# Patient Record
Sex: Female | Born: 1985 | Race: White | Hispanic: No | Marital: Married | Smoking: Former smoker
Health system: Southern US, Community
[De-identification: ages and names within clinical notes are randomized; demographics above are authoritative.]

## PROBLEM LIST (undated history)

## (undated) DIAGNOSIS — E282 Polycystic ovarian syndrome: Secondary | ICD-10-CM

## (undated) DIAGNOSIS — E8881 Metabolic syndrome: Secondary | ICD-10-CM

## (undated) DIAGNOSIS — Z6841 Body Mass Index (BMI) 40.0 and over, adult: Secondary | ICD-10-CM

## (undated) DIAGNOSIS — E88819 Insulin resistance, unspecified: Secondary | ICD-10-CM

## (undated) HISTORY — PX: NO PAST SURGERIES: SHX2092

---
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
## 2013-09-04 ENCOUNTER — Other Ambulatory Visit: Payer: Self-pay | Admitting: Family Medicine

## 2013-09-04 DIAGNOSIS — N926 Irregular menstruation, unspecified: Secondary | ICD-10-CM

## 2013-09-05 ENCOUNTER — Ambulatory Visit
Admission: RE | Admit: 2013-09-05 | Discharge: 2013-09-05 | Disposition: A | Discharge status: Home / Self Care | Payer: 59 | Source: Ambulatory Visit | Attending: Family Medicine | Admitting: Family Medicine

## 2013-09-05 DIAGNOSIS — N926 Irregular menstruation, unspecified: Secondary | ICD-10-CM

## 2014-08-21 ENCOUNTER — Encounter (HOSPITAL_COMMUNITY): Payer: Self-pay | Admitting: *Deleted

## 2014-08-21 ENCOUNTER — Emergency Department (HOSPITAL_COMMUNITY): Payer: 59

## 2014-08-21 ENCOUNTER — Emergency Department (HOSPITAL_COMMUNITY)
Admission: EM | Admit: 2014-08-21 | Discharge: 2014-08-21 | Disposition: A | Discharge status: Home / Self Care | Payer: Self-pay | Attending: Emergency Medicine | Admitting: Emergency Medicine

## 2014-08-21 DIAGNOSIS — Y939 Activity, unspecified: Secondary | ICD-10-CM | POA: Insufficient documentation

## 2014-08-21 DIAGNOSIS — M5442 Lumbago with sciatica, left side: Secondary | ICD-10-CM | POA: Insufficient documentation

## 2014-08-21 DIAGNOSIS — Z720 Tobacco use: Secondary | ICD-10-CM | POA: Insufficient documentation

## 2014-08-21 DIAGNOSIS — Y999 Unspecified external cause status: Secondary | ICD-10-CM | POA: Insufficient documentation

## 2014-08-21 DIAGNOSIS — X58XXXA Exposure to other specified factors, initial encounter: Secondary | ICD-10-CM | POA: Insufficient documentation

## 2014-08-21 DIAGNOSIS — Y929 Unspecified place or not applicable: Secondary | ICD-10-CM | POA: Insufficient documentation

## 2014-08-21 MED ORDER — METHOCARBAMOL 500 MG PO TABS: 500.0000 mg | ORAL_TABLET | Freq: Two times a day (BID) | ORAL | Status: DC

## 2014-08-21 MED ORDER — DIAZEPAM 5 MG/ML IJ SOLN
5.0000 mg | Freq: Once | INTRAMUSCULAR | Status: DC
  Filled 2014-08-21: qty 2

## 2014-08-21 MED ORDER — KETOROLAC TROMETHAMINE 60 MG/2ML IM SOLN
60.0000 mg | Freq: Once | INTRAMUSCULAR | Status: DC
  Filled 2014-08-21: qty 2

## 2014-08-21 MED ORDER — DIAZEPAM 5 MG/ML IJ SOLN
5.0000 mg | Freq: Once | INTRAMUSCULAR | Status: AC
  Administered 2014-08-21: 5 mg via INTRAVENOUS

## 2014-08-21 MED ORDER — KETOROLAC TROMETHAMINE 30 MG/ML IJ SOLN
30.0000 mg | Freq: Once | INTRAMUSCULAR | Status: AC
  Administered 2014-08-21: 30 mg via INTRAVENOUS
  Filled 2014-08-21: qty 1

## 2014-08-21 MED ORDER — HYDROCODONE-ACETAMINOPHEN 5-325 MG PO TABS: 1.0000 | ORAL_TABLET | Freq: Four times a day (QID) | ORAL | Status: DC | PRN

## 2014-08-21 NOTE — ED Notes (Signed)
The pt has pain in her entire spine since Monday when she lifted her dog and a bag of dog  Food.  She  Felt something pop and has had pain since then.  lmp irregular

## 2014-08-21 NOTE — ED Notes (Signed)
Pt stable, ambulatory, states understanding of discharge instructions 

## 2014-08-21 NOTE — Discharge Instructions (Signed)
Please follow up with your primary care physician in 1-2 days. If you do not have one please call the [facility] number listed above. Please take pain medication and/or muscle relaxants as prescribed and as needed for pain. Please do not drive on narcotic pain medication or on muscle relaxants. Please read all discharge instructions and return precautions.  ° °Radicular Pain °Radicular pain in either the arm or leg is usually from a bulging or herniated disk in the spine. A piece of the herniated disk may press against the nerves as the nerves exit the spine. This causes pain which is felt at the tips of the nerves down the arm or leg. Other causes of radicular pain may include: °· Fractures. °· Heart disease. °· Cancer. °· An abnormal and usually degenerative state of the nervous system or nerves (neuropathy). °Diagnosis may require CT or MRI scanning to determine the primary cause.  °Nerves that start at the neck (nerve roots) may cause radicular pain in the outer shoulder and arm. It can spread down to the thumb and fingers. The symptoms vary depending on which nerve root has been affected. In most cases radicular pain improves with conservative treatment. Neck problems may require physical therapy, a neck collar, or cervical traction. Treatment may take many weeks, and surgery may be considered if the symptoms do not improve.  °Conservative treatment is also recommended for sciatica. Sciatica causes pain to radiate from the lower back or buttock area down the leg into the foot. Often there is a history of back problems. Most patients with sciatica are better after 2 to 4 weeks of rest and other supportive care. Short term bed rest can reduce the disk pressure considerably. Sitting, however, is not a good position since this increases the pressure on the disk. You should avoid bending, lifting, and all other activities which make the problem worse. Traction can be used in severe cases.  Surgery is usually reserved for patients who do not improve within the first months of treatment. °Only take over-the-counter or prescription medicines for pain, discomfort, or fever as directed by your caregiver. Narcotics and muscle relaxants may help by relieving more severe pain and spasm and by providing mild sedation. Cold or massage can give significant relief. Spinal manipulation is not recommended. It can increase the degree of disc protrusion. Epidural steroid injections are often effective treatment for radicular pain. These injections deliver medicine to the spinal nerve in the space between the protective covering of the spinal cord and back bones (vertebrae). Your caregiver can give you more information about steroid injections. These injections are most effective when given within two weeks of the onset of pain.  °You should see your caregiver for follow up care as recommended. A program for neck and back injury rehabilitation with stretching and strengthening exercises is an important part of management.  °SEEK IMMEDIATE MEDICAL CARE IF: °· You develop increased pain, weakness, or numbness in your arm or leg. °· You develop difficulty with bladder or bowel control. °· You develop abdominal pain. °Document Released: 02/11/2004 Document Revised: 03/28/2011 Document Reviewed: 04/28/2008 °ExitCare® Patient Information ©2015 [employer]. This information is not intended to replace advice given to you by your health care provider. Make sure you discuss any questions you have with your health care provider. ° °

## 2014-08-21 NOTE — ED Provider Notes (Signed)
CSN: [id number]     Arrival date & time 08/21/14  0404 History   First MD Initiated Contact with Patient 08/21/14 [phone]     Chief Complaint  Patient presents with  . Back Pain     (Consider location/radiation/quality/duration/timing/severity/associated sxs/prior Treatment) HPI Comments: The pt has pain in her entire spine since Monday when she lifted her dog and a bag of dog Food. She Felt something pop and has had pain since then.  Patient is a 29 y.o. female presenting with back pain.  Back Pain Location:  Lumbar spine and sacro-iliac joint Quality:  Shooting and stiffness Stiffness is present:  All day Radiates to:  L posterior upper leg, L knee and L foot Pain severity:  Severe Onset quality:  Sudden Duration:  3 days Timing:  Constant Progression:  Worsening Chronicity:  New Context: lifting heavy objects   Relieved by:  Nothing Worsened by:  Twisting and ambulation Ineffective treatments:  Cold packs and heating pad (Tylenol) Associated symptoms: no bladder incontinence, no bowel incontinence, no fever, no numbness, no paresthesias, no perianal numbness, no tingling and no weakness   Risk factors: no hx of cancer   Risk factors comment:  No h/o IVDA   History reviewed. No pertinent past medical history. History reviewed. No pertinent past surgical history. No family history on file. History  Substance Use Topics  . Smoking status: Current Every Day Smoker  . Smokeless tobacco: Not on file  . Alcohol Use: No   OB History    No data available     Review of Systems  Constitutional: Negative for fever.  Gastrointestinal: Negative for bowel incontinence.  Genitourinary: Negative for bladder incontinence.  Musculoskeletal: Positive for back pain.  Neurological: Negative for tingling, weakness, numbness and paresthesias.  All other systems reviewed and are negative.     Allergies  Review of patient's allergies indicates no known allergies.  Home  Medications   Prior to Admission medications   Medication Sig Start Date End Date Taking? Authorizing Provider  acetaminophen (TYLENOL) 325 MG tablet Take 650 mg by mouth every 6 (six) hours as needed for mild pain.   Yes Historical Provider, MD  HYDROcodone-acetaminophen (NORCO/VICODIN) 5-325 MG per tablet Take 1-2 tablets by mouth every 6 (six) hours as needed. 08/21/14   Kaycie Pegues, PA-C  methocarbamol (ROBAXIN) 500 MG tablet Take 1 tablet (500 mg total) by mouth 2 (two) times daily. 08/21/14   Tyiesha Brackney, PA-C   BP 145/73 mmHg  Pulse 67  Temp(Src) 98 F (36.7 C) (Oral)  Resp 20  Wt 355 lb (161.027 kg)  SpO2 97%  LMP  Physical Exam  Constitutional: She is oriented to person, place, and time. She appears well-developed and well-nourished. No distress.  HENT:  Head: Normocephalic and atraumatic.  Right Ear: External ear normal.  Left Ear: External ear normal.  Nose: Nose normal.  Mouth/Throat: Oropharynx is clear and moist. No oropharyngeal exudate.  Eyes: Conjunctivae and EOM are normal. Pupils are equal, round, and reactive to light.  Neck: Normal range of motion. Neck supple.  Cardiovascular: Normal rate, regular rhythm, normal heart sounds and intact distal pulses.   Pulmonary/Chest: Effort normal and breath sounds normal. No respiratory distress.  Abdominal: Soft. There is no tenderness.  Musculoskeletal:       Cervical back: Normal.       Thoracic back: Normal.       Lumbar back: She exhibits tenderness, pain and spasm. She exhibits normal range of motion, no bony tenderness  and no deformity.  Positive SLE   Neurological: She is alert and oriented to person, place, and time. She has normal strength. No cranial nerve deficit. GCS eye subscore is 4. GCS verbal subscore is 5. GCS motor subscore is 6.  Sensation grossly intact.  No pronator drift.    Skin: Skin is warm and dry. She is not diaphoretic.  Nursing note and vitals reviewed.   ED Course   Procedures (including critical care time) Medications  diazepam (VALIUM) injection 5 mg (5 mg Intravenous Given 08/21/14 0454)  ketorolac (TORADOL) 30 MG/ML injection 30 mg (30 mg Intravenous Given 08/21/14 0506)    Labs Review Labs Reviewed - No data to display  Imaging Review No results found.   EKG Interpretation None      5:50 AM Patient is adamant about receiving imaging will order  MDM   Final diagnoses:  Bilateral low back pain with left-sided sciatica    Filed Vitals:   08/21/14 0550  BP: 145/73  Pulse: 67  Temp:   Resp: 20   Afebrile, NAD, non-toxic appearing, AAOx4.   No red flags for back pain.  No neurological deficits and normal neuro exam. RICE protocol and pain medicine indicated and discussed with patient. Will sign patient out to Arthor Captain, PA-C pending x-ray results.        Francee Piccolo, PA-C 08/21/14 1610  Blake Divine, MD 08/21/14 [phone]

## 2020-02-10 DIAGNOSIS — E8881 Metabolic syndrome: Secondary | ICD-10-CM | POA: Diagnosis present

## 2020-02-10 DIAGNOSIS — E282 Polycystic ovarian syndrome: Secondary | ICD-10-CM | POA: Diagnosis present

## 2020-02-10 DIAGNOSIS — O99210 Obesity complicating pregnancy, unspecified trimester: Secondary | ICD-10-CM | POA: Diagnosis present

## 2020-02-11 DIAGNOSIS — O26899 Other specified pregnancy related conditions, unspecified trimester: Secondary | ICD-10-CM

## 2020-04-05 DIAGNOSIS — O09529 Supervision of elderly multigravida, unspecified trimester: Secondary | ICD-10-CM

## 2020-09-20 ENCOUNTER — Inpatient Hospital Stay (HOSPITAL_COMMUNITY)
Admission: AD | Admit: 2020-09-20 | Discharge: 2020-09-23 | DRG: 786 | Disposition: A | Discharge status: Home / Self Care | Payer: 59 | Attending: Obstetrics and Gynecology | Admitting: Obstetrics and Gynecology

## 2020-09-20 ENCOUNTER — Inpatient Hospital Stay (HOSPITAL_COMMUNITY): Payer: 59 | Admitting: Anesthesiology

## 2020-09-20 ENCOUNTER — Encounter (HOSPITAL_COMMUNITY): Payer: Self-pay | Admitting: Obstetrics and Gynecology

## 2020-09-20 ENCOUNTER — Other Ambulatory Visit: Payer: Self-pay

## 2020-09-20 DIAGNOSIS — O26899 Other specified pregnancy related conditions, unspecified trimester: Secondary | ICD-10-CM

## 2020-09-20 DIAGNOSIS — O403XX0 Polyhydramnios, third trimester, not applicable or unspecified: Secondary | ICD-10-CM | POA: Diagnosis present

## 2020-09-20 DIAGNOSIS — O134 Gestational [pregnancy-induced] hypertension without significant proteinuria, complicating childbirth: Secondary | ICD-10-CM | POA: Diagnosis present

## 2020-09-20 DIAGNOSIS — O2412 Pre-existing diabetes mellitus, type 2, in childbirth: Secondary | ICD-10-CM | POA: Diagnosis present

## 2020-09-20 DIAGNOSIS — O99214 Obesity complicating childbirth: Secondary | ICD-10-CM | POA: Diagnosis present

## 2020-09-20 DIAGNOSIS — O3663X0 Maternal care for excessive fetal growth, third trimester, not applicable or unspecified: Secondary | ICD-10-CM | POA: Diagnosis present

## 2020-09-20 DIAGNOSIS — Z6791 Unspecified blood type, Rh negative: Secondary | ICD-10-CM | POA: Diagnosis not present

## 2020-09-20 DIAGNOSIS — Z3A39 39 weeks gestation of pregnancy: Secondary | ICD-10-CM

## 2020-09-20 DIAGNOSIS — E282 Polycystic ovarian syndrome: Secondary | ICD-10-CM | POA: Diagnosis present

## 2020-09-20 DIAGNOSIS — O9081 Anemia of the puerperium: Secondary | ICD-10-CM | POA: Diagnosis not present

## 2020-09-20 DIAGNOSIS — O09529 Supervision of elderly multigravida, unspecified trimester: Secondary | ICD-10-CM

## 2020-09-20 DIAGNOSIS — O99210 Obesity complicating pregnancy, unspecified trimester: Secondary | ICD-10-CM | POA: Diagnosis present

## 2020-09-20 DIAGNOSIS — O4292 Full-term premature rupture of membranes, unspecified as to length of time between rupture and onset of labor: Principal | ICD-10-CM | POA: Diagnosis present

## 2020-09-20 DIAGNOSIS — O26893 Other specified pregnancy related conditions, third trimester: Secondary | ICD-10-CM | POA: Diagnosis present

## 2020-09-20 DIAGNOSIS — D62 Acute posthemorrhagic anemia: Secondary | ICD-10-CM | POA: Diagnosis not present

## 2020-09-20 DIAGNOSIS — Z20822 Contact with and (suspected) exposure to covid-19: Secondary | ICD-10-CM | POA: Diagnosis present

## 2020-09-20 DIAGNOSIS — Z87891 Personal history of nicotine dependence: Secondary | ICD-10-CM

## 2020-09-20 DIAGNOSIS — E118 Type 2 diabetes mellitus with unspecified complications: Secondary | ICD-10-CM | POA: Diagnosis present

## 2020-09-20 DIAGNOSIS — E8881 Metabolic syndrome: Secondary | ICD-10-CM | POA: Diagnosis present

## 2020-09-20 DIAGNOSIS — O2482 Other pre-existing diabetes mellitus in childbirth: Secondary | ICD-10-CM | POA: Diagnosis not present

## 2020-09-20 HISTORY — DX: Polycystic ovarian syndrome: E28.2

## 2020-09-20 HISTORY — DX: Metabolic syndrome: E88.81

## 2020-09-20 HISTORY — DX: Body Mass Index (BMI) 40.0 and over, adult: Z684

## 2020-09-20 HISTORY — DX: Insulin resistance, unspecified: E88.819

## 2020-09-20 LAB — COMPREHENSIVE METABOLIC PANEL
ALT: 17 U/L (ref 0–44)
AST: 21 U/L (ref 15–41)
Albumin: 2.7 g/dL — ABNORMAL LOW (ref 3.5–5.0)
Alkaline Phosphatase: 73 U/L (ref 38–126)
Anion gap: 10 (ref 5–15)
BUN: 10 mg/dL (ref 6–20)
CO2: 17 mmol/L — ABNORMAL LOW (ref 22–32)
Calcium: 8.7 mg/dL — ABNORMAL LOW (ref 8.9–10.3)
Chloride: 107 mmol/L (ref 98–111)
Creatinine, Ser: 0.85 mg/dL (ref 0.44–1.00)
GFR, Estimated: >60 mL/min (ref >60)
Glucose, Bld: 134 mg/dL — ABNORMAL HIGH (ref 70–99)
Potassium: 3.2 mmol/L — ABNORMAL LOW (ref 3.5–5.1)
Sodium: 134 mmol/L — ABNORMAL LOW (ref 135–145)
Total Bilirubin: 0.8 mg/dL (ref 0.3–1.2)
Total Protein: 5.5 g/dL — ABNORMAL LOW (ref 6.5–8.1)

## 2020-09-20 LAB — GLUCOSE, CAPILLARY
Glucose-Capillary: 131 mg/dL — ABNORMAL HIGH (ref 70–99)
Glucose-Capillary: 110 mg/dL — ABNORMAL HIGH (ref 70–99)

## 2020-09-20 LAB — CBC
HCT: 35.5 % — ABNORMAL LOW (ref 36.0–46.0)
Hemoglobin: 12.1 g/dL (ref 12.0–15.0)
MCH: 29.4 pg (ref 26.0–34.0)
MCHC: 34.1 g/dL (ref 30.0–36.0)
MCV: 86.4 fL (ref 80.0–100.0)
Platelets: 219 10*3/uL (ref 150–400)
RBC: 4.11 MIL/uL (ref 3.87–5.11)
RDW: 14.1 % (ref 11.5–15.5)
WBC: 15 10*3/uL — ABNORMAL HIGH (ref 4.0–10.5)
nRBC: 0 % (ref 0.0–0.2)

## 2020-09-20 LAB — TYPE AND SCREEN
ABO/RH(D): O NEG
Antibody Screen: NEGATIVE

## 2020-09-20 LAB — RESP PANEL BY RT-PCR (FLU A&B, COVID) ARPGX2
Influenza A by PCR: NEGATIVE
Influenza B by PCR: NEGATIVE
SARS Coronavirus 2 by RT PCR: NEGATIVE

## 2020-09-20 MED ORDER — OXYCODONE-ACETAMINOPHEN 5-325 MG PO TABS: 2.0000 | ORAL_TABLET | ORAL | Status: DC | PRN

## 2020-09-20 MED ORDER — EPHEDRINE 5 MG/ML INJ: 10.0000 mg | INTRAVENOUS | Status: DC | PRN

## 2020-09-20 MED ORDER — PHENYLEPHRINE 40 MCG/ML (10ML) SYRINGE FOR IV PUSH (FOR BLOOD PRESSURE SUPPORT): 80.0000 ug | PREFILLED_SYRINGE | INTRAVENOUS | Status: DC | PRN

## 2020-09-20 MED ORDER — OXYCODONE-ACETAMINOPHEN 5-325 MG PO TABS: 1.0000 | ORAL_TABLET | ORAL | Status: DC | PRN

## 2020-09-20 MED ORDER — LACTATED RINGERS IV SOLN: 500.0000 mL | INTRAVENOUS | Status: DC | PRN

## 2020-09-20 MED ORDER — DIPHENHYDRAMINE HCL 50 MG/ML IJ SOLN
12.5000 mg | INTRAMUSCULAR | Status: DC | PRN
Start: 2020-09-20 — End: 2020-09-21
  Administered 2020-09-21: 12.5 mg via INTRAVENOUS
  Filled 2020-09-20: qty 1

## 2020-09-20 MED ORDER — ACETAMINOPHEN 325 MG PO TABS: 650.0000 mg | ORAL_TABLET | ORAL | Status: DC | PRN

## 2020-09-20 MED ORDER — ONDANSETRON HCL 4 MG/2ML IJ SOLN
4.0000 mg | Freq: Four times a day (QID) | INTRAMUSCULAR | Status: DC | PRN
  Administered 2020-09-20: 4 mg via INTRAVENOUS
  Filled 2020-09-20: qty 2

## 2020-09-20 MED ORDER — SODIUM CHLORIDE 0.9 % IV SOLN
5.0000 10*6.[IU] | Freq: Once | INTRAVENOUS | Status: AC
  Administered 2020-09-20: 5 10*6.[IU] via INTRAVENOUS
  Filled 2020-09-20: qty 5

## 2020-09-20 MED ORDER — LIDOCAINE HCL (PF) 1 % IJ SOLN
INTRAMUSCULAR | Status: DC | PRN
  Administered 2020-09-20: 8 mL via EPIDURAL

## 2020-09-20 MED ORDER — FENTANYL-BUPIVACAINE-NACL 0.5-0.125-0.9 MG/250ML-% EP SOLN
EPIDURAL | Status: AC
  Filled 2020-09-20: qty 250

## 2020-09-20 MED ORDER — FENTANYL CITRATE (PF) 100 MCG/2ML IJ SOLN
100.0000 ug | Freq: Once | INTRAMUSCULAR | Status: DC
  Filled 2020-09-20: qty 2

## 2020-09-20 MED ORDER — PENICILLIN G POT IN DEXTROSE 60000 UNIT/ML IV SOLN
3.0000 10*6.[IU] | INTRAVENOUS | Status: DC
  Administered 2020-09-21 (×4): 3 10*6.[IU] via INTRAVENOUS
  Filled 2020-09-20 (×4): qty 50

## 2020-09-20 MED ORDER — LACTATED RINGERS IV SOLN
500.0000 mL | Freq: Once | INTRAVENOUS | Status: AC
  Administered 2020-09-20: 500 mL via INTRAVENOUS

## 2020-09-20 MED ORDER — FENTANYL CITRATE (PF) 100 MCG/2ML IJ SOLN
100.0000 ug | INTRAMUSCULAR | Status: DC | PRN
  Administered 2020-09-20: 100 ug via INTRAVENOUS

## 2020-09-20 MED ORDER — SODIUM CHLORIDE 0.9 % IV SOLN
1.0000 g | INTRAVENOUS | Status: DC
  Administered 2020-09-20: 1 g via INTRAVENOUS
  Filled 2020-09-20: qty 1000

## 2020-09-20 MED ORDER — SOD CITRATE-CITRIC ACID 500-334 MG/5ML PO SOLN
30.0000 mL | ORAL | Status: DC | PRN
  Filled 2020-09-20: qty 30

## 2020-09-20 MED ORDER — FLEET ENEMA 7-19 GM/118ML RE ENEM: 1.0000 | ENEMA | RECTAL | Status: DC | PRN

## 2020-09-20 MED ORDER — SODIUM CHLORIDE 0.9 % IV SOLN
2.0000 g | Freq: Once | INTRAVENOUS | Status: AC
  Administered 2020-09-20: 2 g via INTRAVENOUS
  Filled 2020-09-20: qty 2000

## 2020-09-20 MED ORDER — OXYTOCIN-SODIUM CHLORIDE 30-0.9 UT/500ML-% IV SOLN
2.5000 [IU]/h | INTRAVENOUS | Status: DC
  Filled 2020-09-20: qty 500

## 2020-09-20 MED ORDER — OXYTOCIN BOLUS FROM INFUSION: 333.0000 mL | Freq: Once | INTRAVENOUS | Status: DC

## 2020-09-20 MED ORDER — PHENYLEPHRINE 40 MCG/ML (10ML) SYRINGE FOR IV PUSH (FOR BLOOD PRESSURE SUPPORT)
80.0000 ug | PREFILLED_SYRINGE | INTRAVENOUS | Status: DC | PRN
  Filled 2020-09-20: qty 10

## 2020-09-20 MED ORDER — LIDOCAINE HCL (PF) 1 % IJ SOLN: 30.0000 mL | INTRAMUSCULAR | Status: DC | PRN

## 2020-09-20 MED ORDER — FENTANYL-BUPIVACAINE-NACL 0.5-0.125-0.9 MG/250ML-% EP SOLN
12.0000 mL/h | EPIDURAL | Status: DC | PRN
  Administered 2020-09-20 – 2020-09-21 (×2): 12 mL/h via EPIDURAL
  Filled 2020-09-20: qty 250

## 2020-09-20 MED ORDER — LACTATED RINGERS IV SOLN: INTRAVENOUS | Status: DC

## 2020-09-20 NOTE — Progress Notes (Addendum)
This RN at bedside to administer Penicillin per provider order at 2246. Patient education completed, started IV administration of Penicillin. At 2300, patient and support person requested that I pause administration of the Penicillin until discussion with the provider for reasoning why continued administration of the antibiotics until delivery.  Sharen Counter called to bedside, had a lengthy discussion regarding patient requests and patient education completed with this RN at bedside.  Patient and support person verbalized consent to restart the penicillin at 2349. Education completed and IV penicillin restarted.   MHopkins RN

## 2020-09-20 NOTE — H&P (Signed)
HPI: Stacie Romero is a 35 y.o. year old G1P0 female at [redacted]w[redacted]d weeks gestation by LMP and 11 weeks [place] who presents to MAU reporting Spontaneous rupture of membranes 09/19/20 (unknown time) and Labor. Received prenatal care at [facility]   until 36 weeks. Discontinued care due to not disagreeing with her plan of care. Not able to get many details from pt due to her severe distress with frequent contractions.   Some records available in Care Everywhere but not [place] reports.  Upon review pt was Dx'd w/ LGA, Polyhydramnios, Insulin resistance, PCOS, BMI 52.   Fasting CBG 90's, Postprandials 110-130's per chart. Refused insulin because she thought it would make her more insulin resistant. Had [place] 8/11. Per spouse baby was 9 lb.   OB History     Gravida  5   Para      Term      Preterm      AB  4   Living         SAB  4   IAB      Ectopic      Multiple      Live Births             Past Medical History:  Diagnosis Date   BMI 50.0-59.9, adult (HCC)    Insulin resistance    PCOS (polycystic ovarian syndrome)    Past Surgical History:  Procedure Laterality Date   NO PAST SURGERIES     Family History: family history is not on file. Social History:  reports that she quit smoking about 8 months ago. Her smoking use included cigarettes. She has never used smokeless tobacco. She reports that she does not drink alcohol and does not use drugs.     Maternal Diabetes: Yes:  Diabetes Type:  Pre-pregnancy Genetic Screening: Declined Maternal Ultrasounds/Referrals: Other: Fetal Ultrasounds or other Referrals:  Other:  Maternal Substance Abuse:  No Significant Maternal Medications:  Meds include: Progesterone Other:  Significant Maternal Lab Results:  Other:  Other Comments:   GBS unknown, On ABX for unknown GBS w/ prolonged ROM, Likely Type 2 DM. Was on Metformin before pregnancy. Was D/C's early pregnancy and pt refused insulin. Poly, LGA  Review of  Systems  Constitutional:  Negative for chills and fever.  Eyes:  Negative for visual disturbance.  Gastrointestinal:  Positive for abdominal pain (contractions), nausea and vomiting.  Genitourinary:  Negative for vaginal bleeding.       Leaking mod MSF  Neurological:  Negative for headaches.  Maternal Medical History:  Reason for admission: Rupture of membranes and nausea.  SROM 09/19/20, unknown time, Meconium stained fluid  Contractions: Onset was 13-24 hours ago.   Frequency: regular.   Perceived severity is strong.   Fetal activity: Perceived fetal activity is normal.   Prenatal complications: LGA, Poly Prenatal Complications - Diabetes:  Insulin resistance. Failed GTT prepregnancy. Started on Metformin. Refused insulin in pregnancy. 09/20/20 1418 -- 80 -- 18 143/68    6/90/-1, Ivonne Andrew CNM. Mod MSF.   Maternal Exam:  Uterine Assessment: Contraction frequency is regular.  Abdomen: Patient reports no abdominal tenderness. Estimated fetal weight is 10 lb.   Fetal presentation: vertex Introitus: Normal vulva. Normal vagina.  Amniotic fluid character: meconium stained. Pelvis: questionable for delivery.   Cervix: Cervix evaluated by digital exam.     Fetal Exam Fetal Monitor Review: Baseline rate: 135.  Variability: moderate (6-25 bpm).   Pattern: accelerations present and no decelerations.  Fetal State Assessment: Category I - tracings are normal.  Physical Exam Vitals reviewed. Exam conducted with a chaperone present.  Constitutional:      General: She is in acute distress.     Appearance: She is obese. She is not ill-appearing or toxic-appearing.  Eyes:     Conjunctiva/sclera: Conjunctivae normal.  Cardiovascular:     Rate and Rhythm: Normal rate.  Pulmonary:     Effort: Pulmonary effort is normal. No respiratory distress.  Abdominal:     Palpations: Abdomen is soft.  Genitourinary:    General: Normal vulva.  Musculoskeletal:     Right lower leg: Edema present.      Left lower leg: Edema present.  Skin:    General: Skin is warm and dry.  Neurological:     Mental Status: She is alert.  Psychiatric:        Mood and Affect: Mood normal.    Prenatal labs: ABO, Rh: --/--/O NEG (09/04 1250) Antibody: NEG (09/04 1250) Rubella:   Immune  RPR:   NR HBsAg:   Neg HIV:   NR GBS:   Unkown  Assessment: 1. Labor: Active 2. Fetal Wellbeing: Category I  3. Pain Control: Comfort measures. Requesting Nitrous. Doesn't want epidural at this time. Discussed pain management options since pt is reporting so much pain.  4. GBS: Unknown 5. 39.5 week IUP 6. Insulin resistance vs Type 2 DM 7. New HTN 8. Polyhydramnios 9. Suspected fetal macrosonia  Plan:  1. Admit to BS per consult with MD 2. Routine L&D orders 3. Analgesia/anesthesia PRN  4. Nitrous  5. CBGs Q4 hours 6. Pre-E labs 7. Shoulder dystocia precautions  Dorathy Kinsman 09/20/2020, 10:21 PM

## 2020-09-20 NOTE — Progress Notes (Signed)
Stacie Romero is a 35 y.o. G5P0040 at [redacted]w[redacted]d.  Subjective: Sleeping post-epidural.   Objective: BP 124/67   Pulse 74   Temp 98.3 F (36.8 C) (Axillary)   Resp 16   Ht 5\' 9"  (1.753 m)   LMP 12/17/2019   SpO2 100%   BMI 52.42 kg/m    FHT:  FHR: 130 bpm, variability: mod,  accelerations:  15x15,  decelerations:  none UC:   Q 2-5 minutes, unsure Dilation: 6 Effacement (%): 90 Station: -1 Presentation: Vertex  Labs: NA  Assessment / Plan: [redacted]w[redacted]d week IUP Labor: Active. No cervical change over 7 hours. Unsure if this is due to inadequate contractions or CPD. Recommend IUPC and consider pitocin when pt wakes up (since she is no exhausted) ROM ~24 hours. No evidence of Chorio.  Fetal Wellbeing:  Category I Pain Control:  Epidural Anticipated MOD:  Uncertain.   [redacted]w[redacted]d, Katrinka Blazing, [place] 09/20/2020 7:35 PM

## 2020-09-20 NOTE — Anesthesia Preprocedure Evaluation (Addendum)
Anesthesia Evaluation  Patient identified by MRN, date of birth, ID band Patient awake    Reviewed: Allergy & Precautions, NPO status , Patient's Chart, lab work & pertinent test results  Airway Mallampati: III  TM Distance: >3 FB Neck ROM: Full    Dental no notable dental hx.    Pulmonary former smoker,    Pulmonary exam normal breath sounds clear to auscultation       Cardiovascular negative cardio ROS Normal cardiovascular exam Rhythm:Regular Rate:Normal     Neuro/Psych negative neurological ROS  negative psych ROS   GI/Hepatic negative GI ROS, Neg liver ROS,   Endo/Other  Morbid obesity  Renal/GU negative Renal ROS  negative genitourinary   Musculoskeletal negative musculoskeletal ROS (+)   Abdominal (+) + obese,   Peds negative pediatric ROS (+)  Hematology negative hematology ROS (+)   Anesthesia Other Findings   Reproductive/Obstetrics negative OB ROS                             Anesthesia Physical Anesthesia Plan  ASA: 4  Anesthesia Plan: Epidural   Post-op Pain Management:    Induction: Intravenous  PONV Risk Score and Plan: 2 and Treatment may vary due to age or medical condition  Airway Management Planned: Natural Airway  Additional Equipment:   Intra-op Plan:   Post-operative Plan:   Informed Consent: I have reviewed the patients History and Physical, chart, labs and discussed the procedure including the risks, benefits and alternatives for the proposed anesthesia with the patient or authorized representative who has indicated his/her understanding and acceptance.       Plan Discussed with: Anesthesiologist  Anesthesia Plan Comments:         Anesthesia Quick Evaluation

## 2020-09-20 NOTE — Anesthesia Procedure Notes (Signed)
Epidural Patient location during procedure: OB Start time: 09/20/2020 5:20 PM End time: 09/20/2020 5:35 PM  Staffing Anesthesiologist: Mellody Dance, MD Performed: anesthesiologist   Preanesthetic Checklist Completed: patient identified, IV checked, site marked, risks and benefits discussed, monitors and equipment checked, pre-op evaluation and timeout performed  Epidural Patient position: sitting Prep: DuraPrep Patient monitoring: heart rate, cardiac monitor, continuous pulse ox and blood pressure Approach: midline Location: L2-L3 Injection technique: LOR saline  Needle:  Needle type: Tuohy  Needle gauge: 17 G Needle length: 9 cm Needle insertion depth: 8 cm Catheter type: closed end flexible Catheter size: 20 Guage Catheter at skin depth: 13 cm Test dose: negative and Other  Assessment Events: blood not aspirated, injection not painful, no injection resistance and negative IV test  Additional Notes Informed consent obtained prior to proceeding including risk of failure, 1% risk of PDPH, risk of minor discomfort and bruising.  Discussed rare but serious complications including epidural abscess, permanent nerve injury, epidural hematoma.  Discussed alternatives to epidural analgesia and patient desires to proceed.  Timeout performed pre-procedure verifying patient name, procedure, and platelet count.  Patient tolerated procedure well.

## 2020-09-20 NOTE — MAU Note (Signed)
Pt states she felt dribble of fluid yesterday, usure of time. Contractions started last night. Pt gets prenatal care at [facility] in [place]. Last prenatal visit was at 36 weeks. Denies any problems during this pregancy.

## 2020-09-21 ENCOUNTER — Encounter (HOSPITAL_COMMUNITY): Payer: Self-pay | Admitting: Obstetrics and Gynecology

## 2020-09-21 ENCOUNTER — Encounter (HOSPITAL_COMMUNITY)
Admission: AD | Disposition: A | Discharge status: Home / Self Care | Payer: Self-pay | Source: Home / Self Care | Attending: Obstetrics and Gynecology

## 2020-09-21 DIAGNOSIS — Z3A39 39 weeks gestation of pregnancy: Secondary | ICD-10-CM

## 2020-09-21 DIAGNOSIS — O2482 Other pre-existing diabetes mellitus in childbirth: Secondary | ICD-10-CM

## 2020-09-21 LAB — GLUCOSE, CAPILLARY
Glucose-Capillary: 100 mg/dL — ABNORMAL HIGH (ref 70–99)
Glucose-Capillary: 104 mg/dL — ABNORMAL HIGH (ref 70–99)
Glucose-Capillary: 107 mg/dL — ABNORMAL HIGH (ref 70–99)
Glucose-Capillary: 100 mg/dL — ABNORMAL HIGH (ref 70–99)
Glucose-Capillary: 83 mg/dL (ref 70–99)
Glucose-Capillary: 103 mg/dL — ABNORMAL HIGH (ref 70–99)

## 2020-09-21 LAB — PROTEIN / CREATININE RATIO, URINE
Creatinine, Urine: 97.67 mg/dL
Protein Creatinine Ratio: 0.16 mg/mg{Cre} — ABNORMAL HIGH (ref 0.00–0.15)
Total Protein, Urine: 16 mg/dL

## 2020-09-21 LAB — RPR: RPR Ser Ql: NONREACTIVE

## 2020-09-21 SURGERY — Surgical Case
Anesthesia: Epidural

## 2020-09-21 MED ORDER — FENTANYL CITRATE (PF) 100 MCG/2ML IJ SOLN
INTRAMUSCULAR | Status: AC
  Filled 2020-09-21: qty 2

## 2020-09-21 MED ORDER — MORPHINE SULFATE (PF) 0.5 MG/ML IJ SOLN
INTRAMUSCULAR | Status: DC | PRN
  Administered 2020-09-21: 3 mg via EPIDURAL

## 2020-09-21 MED ORDER — SODIUM CHLORIDE 0.9% FLUSH: 3.0000 mL | INTRAVENOUS | Status: DC | PRN

## 2020-09-21 MED ORDER — SODIUM CHLORIDE 0.9 % IV SOLN
INTRAVENOUS | Status: AC
  Filled 2020-09-21: qty 500

## 2020-09-21 MED ORDER — FENTANYL CITRATE (PF) 100 MCG/2ML IJ SOLN
INTRAMUSCULAR | Status: DC | PRN
  Administered 2020-09-21: 100 ug via EPIDURAL

## 2020-09-21 MED ORDER — STERILE WATER FOR IRRIGATION IR SOLN
Status: DC | PRN
  Administered 2020-09-21: 1000 mL

## 2020-09-21 MED ORDER — NALBUPHINE HCL 10 MG/ML IJ SOLN
5.0000 mg | Freq: Once | INTRAMUSCULAR | Status: DC | PRN
Start: 2020-09-21 — End: 2020-09-24

## 2020-09-21 MED ORDER — SOD CITRATE-CITRIC ACID 500-334 MG/5ML PO SOLN
30.0000 mL | ORAL | Status: AC
  Administered 2020-09-21: 30 mL via ORAL

## 2020-09-21 MED ORDER — LIDOCAINE-EPINEPHRINE (PF) 2 %-1:200000 IJ SOLN
INTRAMUSCULAR | Status: DC | PRN
  Administered 2020-09-21: 4 mL via EPIDURAL
  Administered 2020-09-21: 3 mL via EPIDURAL
  Administered 2020-09-21: 1 mL via EPIDURAL
  Administered 2020-09-21: 5 mL via EPIDURAL
  Administered 2020-09-21: 2 mL via EPIDURAL

## 2020-09-21 MED ORDER — COCONUT OIL OIL: 1.0000 "application " | TOPICAL_OIL | Status: DC | PRN

## 2020-09-21 MED ORDER — KETOROLAC TROMETHAMINE 30 MG/ML IJ SOLN: 30.0000 mg | Freq: Once | INTRAMUSCULAR | Status: DC | PRN

## 2020-09-21 MED ORDER — SENNOSIDES-DOCUSATE SODIUM 8.6-50 MG PO TABS
2.0000 | ORAL_TABLET | Freq: Every day | ORAL | Status: DC
  Administered 2020-09-23: 2 via ORAL
  Filled 2020-09-21 (×2): qty 2

## 2020-09-21 MED ORDER — WITCH HAZEL-GLYCERIN EX PADS: 1.0000 "application " | MEDICATED_PAD | CUTANEOUS | Status: DC | PRN

## 2020-09-21 MED ORDER — NALOXONE HCL 4 MG/10ML IJ SOLN
1.0000 ug/kg/h | INTRAVENOUS | Status: DC | PRN
  Filled 2020-09-21: qty 5

## 2020-09-21 MED ORDER — TRANEXAMIC ACID-NACL 1000-0.7 MG/100ML-% IV SOLN
INTRAVENOUS | Status: DC | PRN
  Administered 2020-09-21: 1000 mg via INTRAVENOUS

## 2020-09-21 MED ORDER — ONDANSETRON HCL 4 MG/2ML IJ SOLN: 4.0000 mg | Freq: Once | INTRAMUSCULAR | Status: DC | PRN

## 2020-09-21 MED ORDER — ONDANSETRON HCL 4 MG/2ML IJ SOLN: 4.0000 mg | Freq: Three times a day (TID) | INTRAMUSCULAR | Status: DC | PRN

## 2020-09-21 MED ORDER — DEXMEDETOMIDINE (PRECEDEX) IN NS 20 MCG/5ML (4 MCG/ML) IV SYRINGE
PREFILLED_SYRINGE | INTRAVENOUS | Status: DC | PRN
  Administered 2020-09-21: 8 ug via INTRAVENOUS
  Administered 2020-09-21: 4 ug via INTRAVENOUS

## 2020-09-21 MED ORDER — SIMETHICONE 80 MG PO CHEW
80.0000 mg | CHEWABLE_TABLET | Freq: Three times a day (TID) | ORAL | Status: DC
  Administered 2020-09-23 (×2): 80 mg via ORAL
  Filled 2020-09-21 (×3): qty 1

## 2020-09-21 MED ORDER — PRENATAL MULTIVITAMIN CH
1.0000 | ORAL_TABLET | Freq: Every day | ORAL | Status: DC
  Administered 2020-09-23: 1 via ORAL
  Filled 2020-09-21: qty 1

## 2020-09-21 MED ORDER — ZOLPIDEM TARTRATE 5 MG PO TABS: 5.0000 mg | ORAL_TABLET | Freq: Every evening | ORAL | Status: DC | PRN

## 2020-09-21 MED ORDER — OXYCODONE HCL 5 MG/5ML PO SOLN: 5.0000 mg | Freq: Once | ORAL | Status: DC | PRN

## 2020-09-21 MED ORDER — DEXAMETHASONE SODIUM PHOSPHATE 4 MG/ML IJ SOLN
INTRAMUSCULAR | Status: DC | PRN
  Administered 2020-09-21: 4 mg via INTRAVENOUS

## 2020-09-21 MED ORDER — SIMETHICONE 80 MG PO CHEW: 80.0000 mg | CHEWABLE_TABLET | ORAL | Status: DC | PRN

## 2020-09-21 MED ORDER — OXYTOCIN-SODIUM CHLORIDE 30-0.9 UT/500ML-% IV SOLN
INTRAVENOUS | Status: AC
  Filled 2020-09-21: qty 500

## 2020-09-21 MED ORDER — OXYTOCIN-SODIUM CHLORIDE 30-0.9 UT/500ML-% IV SOLN
2.5000 [IU]/h | INTRAVENOUS | Status: AC
  Administered 2020-09-22: 2.5 [IU]/h via INTRAVENOUS
  Filled 2020-09-21: qty 500

## 2020-09-21 MED ORDER — ACETAMINOPHEN 325 MG PO TABS
650.0000 mg | ORAL_TABLET | ORAL | Status: DC | PRN
  Administered 2020-09-22: 650 mg via ORAL
  Filled 2020-09-21: qty 2

## 2020-09-21 MED ORDER — DIBUCAINE (PERIANAL) 1 % EX OINT: 1.0000 "application " | TOPICAL_OINTMENT | CUTANEOUS | Status: DC | PRN

## 2020-09-21 MED ORDER — MENTHOL 3 MG MT LOZG: 1.0000 | LOZENGE | OROMUCOSAL | Status: DC | PRN

## 2020-09-21 MED ORDER — OXYCODONE HCL 5 MG PO TABS: 5.0000 mg | ORAL_TABLET | Freq: Once | ORAL | Status: DC | PRN

## 2020-09-21 MED ORDER — ONDANSETRON HCL 4 MG/2ML IJ SOLN
INTRAMUSCULAR | Status: AC
  Filled 2020-09-21: qty 2

## 2020-09-21 MED ORDER — FENTANYL CITRATE (PF) 100 MCG/2ML IJ SOLN
INTRAMUSCULAR | Status: DC | PRN
  Administered 2020-09-21 (×2): 25 ug via INTRAVENOUS
  Administered 2020-09-21: 50 ug via INTRAVENOUS

## 2020-09-21 MED ORDER — ENOXAPARIN SODIUM 80 MG/0.8ML IJ SOSY: 0.5000 mg/kg | PREFILLED_SYRINGE | INTRAMUSCULAR | Status: DC

## 2020-09-21 MED ORDER — OXYTOCIN-SODIUM CHLORIDE 30-0.9 UT/500ML-% IV SOLN
1.0000 m[IU]/min | INTRAVENOUS | Status: DC
  Administered 2020-09-21: 2 m[IU]/min via INTRAVENOUS

## 2020-09-21 MED ORDER — DEXMEDETOMIDINE (PRECEDEX) IN NS 20 MCG/5ML (4 MCG/ML) IV SYRINGE
PREFILLED_SYRINGE | INTRAVENOUS | Status: AC
  Filled 2020-09-21: qty 5

## 2020-09-21 MED ORDER — NALBUPHINE HCL 10 MG/ML IJ SOLN
5.0000 mg | INTRAMUSCULAR | Status: DC | PRN
Start: 2020-09-21 — End: 2020-09-24

## 2020-09-21 MED ORDER — CEFAZOLIN IN SODIUM CHLORIDE 3-0.9 GM/100ML-% IV SOLN
3.0000 g | INTRAVENOUS | Status: AC
  Administered 2020-09-21: 3 g via INTRAVENOUS
  Filled 2020-09-21: qty 100

## 2020-09-21 MED ORDER — MORPHINE SULFATE (PF) 0.5 MG/ML IJ SOLN
INTRAMUSCULAR | Status: AC
  Filled 2020-09-21: qty 10

## 2020-09-21 MED ORDER — SODIUM CHLORIDE 0.9 % IV SOLN
500.0000 mg | INTRAVENOUS | Status: AC
  Administered 2020-09-21: 250 mg via INTRAVENOUS

## 2020-09-21 MED ORDER — DIPHENHYDRAMINE HCL 25 MG PO CAPS: 25.0000 mg | ORAL_CAPSULE | ORAL | Status: DC | PRN

## 2020-09-21 MED ORDER — SODIUM BICARBONATE 8.4 % IV SOLN
INTRAVENOUS | Status: AC
  Filled 2020-09-21: qty 50

## 2020-09-21 MED ORDER — NALOXONE HCL 0.4 MG/ML IJ SOLN: 0.4000 mg | INTRAMUSCULAR | Status: DC | PRN

## 2020-09-21 MED ORDER — SCOPOLAMINE 1 MG/3DAYS TD PT72: 1.0000 | MEDICATED_PATCH | Freq: Once | TRANSDERMAL | Status: DC

## 2020-09-21 MED ORDER — TRANEXAMIC ACID-NACL 1000-0.7 MG/100ML-% IV SOLN
INTRAVENOUS | Status: AC
  Filled 2020-09-21: qty 100

## 2020-09-21 MED ORDER — SODIUM CHLORIDE 0.9 % IR SOLN
Status: DC | PRN
  Administered 2020-09-21: 1000 mL

## 2020-09-21 MED ORDER — ONDANSETRON HCL 4 MG/2ML IJ SOLN
INTRAMUSCULAR | Status: DC | PRN
  Administered 2020-09-21: 4 mg via INTRAVENOUS

## 2020-09-21 MED ORDER — DIPHENHYDRAMINE HCL 25 MG PO CAPS: 25.0000 mg | ORAL_CAPSULE | Freq: Four times a day (QID) | ORAL | Status: DC | PRN

## 2020-09-21 MED ORDER — OXYTOCIN-SODIUM CHLORIDE 30-0.9 UT/500ML-% IV SOLN
INTRAVENOUS | Status: DC | PRN
  Administered 2020-09-21: 100 mL via INTRAVENOUS
  Administered 2020-09-21: 300 mL via INTRAVENOUS

## 2020-09-21 MED ORDER — DIPHENHYDRAMINE HCL 50 MG/ML IJ SOLN: 12.5000 mg | INTRAMUSCULAR | Status: DC | PRN

## 2020-09-21 MED ORDER — HYDROMORPHONE HCL 1 MG/ML IJ SOLN: 0.2500 mg | INTRAMUSCULAR | Status: DC | PRN

## 2020-09-21 MED ORDER — NALBUPHINE HCL 10 MG/ML IJ SOLN: 5.0000 mg | INTRAMUSCULAR | Status: DC | PRN

## 2020-09-21 MED ORDER — TERBUTALINE SULFATE 1 MG/ML IJ SOLN: 0.2500 mg | Freq: Once | INTRAMUSCULAR | Status: DC | PRN

## 2020-09-21 MED ORDER — TETANUS-DIPHTH-ACELL PERTUSSIS 5-2.5-18.5 LF-MCG/0.5 IM SUSY
0.5000 mL | PREFILLED_SYRINGE | Freq: Once | INTRAMUSCULAR | Status: DC
Start: 2020-09-22 — End: 2020-09-24

## 2020-09-21 MED ORDER — OXYCODONE HCL 5 MG PO TABS: 5.0000 mg | ORAL_TABLET | ORAL | Status: DC | PRN

## 2020-09-21 MED ORDER — DEXAMETHASONE SODIUM PHOSPHATE 4 MG/ML IJ SOLN
INTRAMUSCULAR | Status: AC
  Filled 2020-09-21: qty 2

## 2020-09-21 SURGICAL SUPPLY — 37 items
APL SKNCLS STERI-STRIP NONHPOA (GAUZE/BANDAGES/DRESSINGS)
BENZOIN TINCTURE PRP APPL 2/3 (GAUZE/BANDAGES/DRESSINGS) IMPLANT
CLOTH BEACON ORANGE TIMEOUT ST (SAFETY) ×2 IMPLANT
DRESSING PREVENA PLUS CUSTOM (GAUZE/BANDAGES/DRESSINGS) ×1 IMPLANT
DRSG OPSITE POSTOP 4X10 (GAUZE/BANDAGES/DRESSINGS) ×2 IMPLANT
DRSG PREVENA PLUS CUSTOM (GAUZE/BANDAGES/DRESSINGS) ×2
ELECT REM PT RETURN 9FT ADLT (ELECTROSURGICAL) ×2
ELECTRODE REM PT RTRN 9FT ADLT (ELECTROSURGICAL) ×1 IMPLANT
EXTRACTOR VACUUM KIWI (MISCELLANEOUS) IMPLANT
GLOVE BIOGEL PI IND STRL 7.0 (GLOVE) ×1 IMPLANT
GLOVE BIOGEL PI INDICATOR 7.0 (GLOVE) ×1
GLOVE SURG ORTHO 8.0 STRL STRW (GLOVE) ×2 IMPLANT
GOWN STRL REUS W/TWL LRG LVL3 (GOWN DISPOSABLE) ×4 IMPLANT
HEMOSTAT ARISTA ABSORB 3G PWDR (HEMOSTASIS) ×1 IMPLANT
KIT ABG SYR 3ML LUER SLIP (SYRINGE) IMPLANT
NDL HYPO 25X5/8 SAFETYGLIDE (NEEDLE) IMPLANT
NEEDLE HYPO 25X5/8 SAFETYGLIDE (NEEDLE) IMPLANT
NS IRRIG 1000ML POUR BTL (IV SOLUTION) ×2 IMPLANT
PACK C SECTION WH (CUSTOM PROCEDURE TRAY) ×2 IMPLANT
PAD OB MATERNITY 4.3X12.25 (PERSONAL CARE ITEMS) ×2 IMPLANT
PENCIL SMOKE EVAC W/HOLSTER (ELECTROSURGICAL) ×2 IMPLANT
RTRCTR C-SECT PINK 25CM LRG (MISCELLANEOUS) IMPLANT
SPONGE T-LAP 18X18 ~~LOC~~+RFID (SPONGE) ×2 IMPLANT
STRIP CLOSURE SKIN 1/2X4 (GAUZE/BANDAGES/DRESSINGS) IMPLANT
SUT CHROMIC 0 CTX 36 (SUTURE) ×1 IMPLANT
SUT MON AB-0 CT1 36 (SUTURE) ×8 IMPLANT
SUT PLAIN 0 NONE (SUTURE) IMPLANT
SUT PLAIN 2 0 (SUTURE) ×2
SUT PLAIN ABS 2-0 CT1 27XMFL (SUTURE) IMPLANT
SUT VIC AB 0 CT1 27 (SUTURE) ×4
SUT VIC AB 0 CT1 27XBRD ANBCTR (SUTURE) ×2 IMPLANT
SUT VIC AB 2-0 CT1 27 (SUTURE) ×2
SUT VIC AB 2-0 CT1 TAPERPNT 27 (SUTURE) ×1 IMPLANT
SUT VIC AB 4-0 PS2 27 (SUTURE) ×4 IMPLANT
TOWEL OR 17X24 6PK STRL BLUE (TOWEL DISPOSABLE) ×2 IMPLANT
TRAY FOLEY W/BAG SLVR 14FR LF (SET/KITS/TRAYS/PACK) ×2 IMPLANT
WATER STERILE IRR 1000ML POUR (IV SOLUTION) ×2 IMPLANT

## 2020-09-21 NOTE — Progress Notes (Signed)
AZAYLIA FONG is a 35 y.o. G5P0040 at [redacted]w[redacted]d by admitted for     Objective: BP 137/83   Pulse 70   Temp 98.3 F (36.8 C) (Axillary)   Resp 18   Ht 5\' 9"  (1.753 m)   LMP 12/17/2019   SpO2 100%   BMI 52.42 kg/m  I/O last 3 completed shifts: In: -  Out: 1650 [Urine:1650] No intake/output data recorded.  FHT:  FHR: 135 bpm, variability: moderate,  accelerations:  Present,  decelerations:  Absent UC:   regular, every 4 minutes SVE:   Dilation: 8.5 Effacement (%): 80 (swollen) Station: -2 Exam by:: [email or web address] CNM  Labs: Lab Results  Component Value Date   WBC 15.0 (H) 09/20/2020   HGB 12.1 09/20/2020   HCT 35.5 (L) 09/20/2020   MCV 86.4 09/20/2020   PLT 219 09/20/2020    Assessment / Plan: SOL  Labor:  inadequate MVUs currently, titrate Pitocin up for adequate advice Preeclampsia:   n/a Fetal Wellbeing:  Category I and Category II Pain Control:  Labor support without medications I/D:  n/a Anticipated MOD:  NSVD  11/20/2020 09/21/2020, 10:28 AM

## 2020-09-21 NOTE — Progress Notes (Signed)
Patient ID: Stacie Romero, female   DOB: Feb 09, 1985, 35 y.o.   MRN: [id number]  Feeling less burning now; overall comfortable w epidural  BP 118/56, P 63, T 98.4 FHR 120s, +accels, no decels, Cat 1 Ctx q 2-3 mins with Pit at 85mu/min, adequate MVUs Cx rim from 1-3 o'clock/90/vtx 0  IUP@39 .6wks gHTN (labs neg) Poly & LGA Active labor  Plan to check in 2hrs for progress Anticipate vag del  Arabella Merles [facility] 09/21/2020 12:18 PM

## 2020-09-21 NOTE — Progress Notes (Signed)
Patient ID: Stacie Romero, female   DOB: 1985/05/11, 35 y.o.   MRN: [id number]  Feeling a bit more rectal pressure but overall well  BPs 126/61, 136/101 T 98.4 FHR 120-130s, +accels, occ variables, Cat 1 Ctx q 2-4 mins, adequate MVUs, Pit @ 86mu/min Cx still with a rim of cx 1-3 o'clock/vtx 0  CBG 107  IUP@39 .6wks Protracted active phase Prolonged ROM >24h- afebrile gHTN- stable Poly & LGA Insulin resistance- CBGs stable  Keep MVUs adequate and recheck cx in 2hrs; pt and fam aware of recommendation for c/s if cx is unchanged at next exam; will try various position changes during this time  Arabella Merles CNM 09/21/2020 2:26 PM

## 2020-09-21 NOTE — Progress Notes (Signed)
Stacie Romero is a 35 y.o. G5P0040 at [redacted]w[redacted]d by LMP admitted for active labor, rupture of membranes  Subjective:   Objective: BP 132/73   Pulse 72   Temp 98.3 F (36.8 C) (Axillary)   Resp 16   Ht 5\' 9"  (1.753 m)   LMP 12/17/2019   SpO2 100%   BMI 52.42 kg/m  No intake/output data recorded. No intake/output data recorded.  FHT:  FHR: 135 bpm, variability: moderate,  accelerations:  Present,  decelerations:  Absent UC:   regular, every 3-4 minutes SVE:   Dilation: 6 Effacement (%): 90 Station: -1 Exam by:: [email or web address] CNM  IUPC placed without difficulty. Pt tolerated well.  Labs: Lab Results  Component Value Date   WBC 15.0 (H) 09/20/2020   HGB 12.1 09/20/2020   HCT 35.5 (L) 09/20/2020   MCV 86.4 09/20/2020   PLT 219 09/20/2020   CBG (last 3)  Recent Labs    09/20/20 2013 09/20/20 2139  GLUCAP 131* 110*    Assessment / Plan: Arrest in active phase of labor  Labor:  Called to pt room due to questions pt and her support person have regarding antibiotics, labor progress, delivery plans, etc.  Questions answered and reviewed her admission, medications ordered and planned, and benefits/risks/alternatives for each.  Pt and support person state understanding and given options, elect to proceed with IUPC insertion and Pitocin for augmentation. Discussed that poor labor progress in the presence of adequate contractions may indicate concerns about fetal size and disproportion to pt pelvis. Pt states understanding.  After IUPC placed, contractions less than adequate so will titrate Pitocin up and reevaluate when adequate MVUs are reached. Preeclampsia:   n/a Fetal Wellbeing:  Category I Pain Control:  Epidural I/D:   GBS unknown, PCN for unknown and prolonged ROM Anticipated MOD:  NSVD  2140 09/21/2020, 1:38 AM

## 2020-09-21 NOTE — Progress Notes (Signed)
Labor Progress Note Stacie Romero is a 35 y.o. G5P0040 at [redacted]w[redacted]d presented for augmentation of labor after PROM S: Pt seen, she is comfortable with epidural.  O:  BP (!) 131/56   Pulse (!) 59   Temp 98.3 F (36.8 C) (Oral)   Resp 17   Ht 5\' 9"  (1.753 m)   Wt (!) 161 kg   LMP 12/17/2019   SpO2 100%   BMI 52.42 kg/m  EFM: baseline  130s/moderate variability/accels noted, no decels, category 1 strip  CVE: Dilation: 9 Effacement (%):  (swollen on left side) Station: 0, -1 Presentation: Vertex Exam by:: Dr [email or web address]  Cervix unchanged for approximately 8 hours, no fetal descent noted A&P: 35 y.o. 31 [redacted]w[redacted]d failure to progress/descend Possible LGA baby. No significant change in cervical dilation for several hours. No signs or symptoms of chorioamnionitis. Discussed need for primary cesarean section due to failure to progress.  The risks of surgery were discussed with the patient including but were not limited to: bleeding which may require transfusion or reoperation; infection which may require antibiotics; injury to bowel, bladder, ureters or other surrounding organs; injury to the fetus; need for additional procedures including hysterectomy in the event of a life-threatening hemorrhage; formation of adhesions; placental abnormalities wth subsequent pregnancies; incisional problems; thromboembolic phenomenon and other postoperative/anesthesia complications.  The patient concurred with the proposed plan, giving informed written consent for the procedure.   Patient has been NPO since admission she will remain NPO for procedure. Anesthesia and OR aware. Preoperative prophylactic antibiotics and SCDs ordered on call to the OR.  To OR when ready.    [redacted]w[redacted]d, MD 8:28 PM

## 2020-09-21 NOTE — Progress Notes (Signed)
Patient ID: Stacie Romero, female   DOB: 1985/02/04, 35 y.o.   MRN: [id number]  Sitting up in throne position currently; has been in various positions  Last 2 BPs: 159/70, 144/79, P 55, T 98 FHR 120-130s, +accels, occ variable, +SS Ctx q 2-4 mins with Pit at 65mu/min; MVUs adequate since 0400/0500 Cx 9/90/vtx 0 (feels like a puffy rim all the way around the vtx now)  IUP@39 .6wks gHTN  Poly/LGA Insulin resistance Protracted active phase  Discussed w Dr Donavan Foil- will try IV Benadryl right now and he will come see the pt and determine a plan  Arabella Merles CNM 09/21/2020 4:40 PM

## 2020-09-21 NOTE — Op Note (Signed)
Stacie Romero PROCEDURE DATE: 09/21/2020  PREOPERATIVE DIAGNOSES: Intrauterine pregnancy at [redacted]w[redacted]d weeks gestation; failure to progress: arrest of descent and failure to progress: arrest of dilation  POSTOPERATIVE DIAGNOSES: The same  PROCEDURE: Primary Low Transverse Cesarean Section  SURGEON:  Dr. Mariel Aloe  ASSISTANT: Dr. Austin Miles  An experienced assistant was required given the standard of surgical care given the complexity of the case.  This assistant was needed for exposure, dissection, suctioning, retraction, instrument exchange,  assisting with delivery with administration of fundal pressure, and for overall help during the procedure.    ANESTHESIOLOGY TEAM: Anesthesiologist: Mellody Dance, MD; Trevor Iha, MD CRNA: Algis Greenhouse, CRNA  INDICATIONS: Stacie Romero is a 35 y.o. [phone] at [redacted]w[redacted]d here for cesarean section secondary to the indications listed under preoperative diagnoses; please see preoperative note for further details.  The risks of surgery were discussed with the patient including but were not limited to: bleeding which may require transfusion or reoperation; infection which may require antibiotics; injury to bowel, bladder, ureters or other surrounding organs; injury to the fetus; need for additional procedures including hysterectomy in the event of a life-threatening hemorrhage; formation of adhesions; placental abnormalities wth subsequent pregnancies; incisional problems; thromboembolic phenomenon and other postoperative/anesthesia complications.  The patient concurred with the proposed plan, giving informed written consent for the procedure.    FINDINGS:  Viable female infant in cephalic presentation.  Apgars 8 and 9.  Clear amniotic fluid.  Intact placenta, three vessel cord.  Normal uterus, fallopian tubes and ovaries bilaterally.  ANESTHESIA: Epidural  INTRAVENOUS FLUIDS: 1300 ml   ESTIMATED BLOOD LOSS: 909 ml URINE OUTPUT:  200 ml SPECIMENS:  Placenta sent to L&D COMPLICATIONS: None immediate  PROCEDURE IN DETAIL:  The patient preoperatively received intravenous antibiotics and had sequential compression devices applied to her lower extremities.  She was then taken to the operating room where the epidural anesthesia was dosed up to surgical level and was found to be adequate. She was then placed in a dorsal supine position with a leftward tilt, and prepped and draped in a sterile manner.  A foley catheter was placed into her bladder and attached to constant gravity.  After an adequate timeout was performed, a Pfannenstiel skin incision was made with scalpel two fingerbreaths above the pubic symphysis and carried through to the underlying layer of fascia. The fascia was incised in the midline, and this incision was extended bilaterally using the Mayo scissors.  Kocher clamps x 2 were applied to the superior aspect of the fascial incision and the underlying rectus muscles were dissected off bluntly and sharply.  A similar process was carried out on the inferior aspect of the fascial incision. The rectus muscles were separated in the midline and the peritoneum was entered bluntly. The Alexis self-retaining retractor was introduced into the abdominal cavity.  The vesicouterine peritoneum was identified and grasped using smooth pickups.  It was incised and extended laterally using the Metzenbaum scissors.  A bladder flap was then digitally created.  Attention was turned to the lower uterine segment where a low transverse hysterotomy was made with a scalpel and extended bilaterally bluntly.  The infant was successfully delivered, the cord was clamped and cut after 30 seconds, and the infant was handed over to the awaiting neonatology team. Uterine massage was then administered, and the placenta delivered intact with a three-vessel cord. The uterus was then cleared of clots and debris using manual curettage.  The uterine incision was closed with  0 monocryl  in a running locked fashion, and an imbricating layer was also placed with 0 monocryl.    The pelvis was cleared of all clot and debris with irrigation and suction. Hemostasis was confirmed on all surfaces.  The retractor was removed.  The peritoneum was closed with a 2-0 Vicryl running stitch. The fascia was then closed using 0 Vicryl in a running fashion.  The subcutaneous layer was irrigated, reapproximated with 2-0 plain gut interrupted stitches, and the skin was closed with a 4-0 Vicryl subcuticular stitch. A preveena negtive pressure device was applied and activated.  The patient tolerated the procedure well. Sponge, instrument and needle counts were correct x 3.  She was taken to the recovery room in stable condition.    Mariel Aloe, MD, FACOG Obstetrician & Gynecologist, [facility] for [facility], [facility]

## 2020-09-21 NOTE — Progress Notes (Signed)
Patient ID: Stacie Romero, female   DOB: [birth year], 35 y.o.   MRN: [id number]  Having a 'burning' sensation in her vag with ctx; some pressure; overall epidural working moderately well; MVUs adequate since ~0530  BP 137/83, P 70 FHR 120-130s, +accels, occ variables, Cat 1 Ctx q 2-3 mins with Pit at 21mu/min; MVUs 180-220 Cx 8+/80-90/vtx -2 Bedside u/s: appears OP positioning  CBGs 104, 100  IUP@39 .6wks gHTN Polyhydramnios LGA Insulin resistance Active labor w protracted progress  Continue w lateral Sims and peanut ball rotation Epidural PCA for comfort Plan on cx exam at noon for progress  Arabella Merles CNM 09/21/2020

## 2020-09-21 NOTE — Transfer of Care (Signed)
Immediate Anesthesia Transfer of Care Note  Patient: Stacie Romero  Procedure(s) Performed: CESAREAN SECTION  Patient Location: PACU  Anesthesia Type:Epidural  Level of Consciousness: awake  Airway & Oxygen Therapy: Patient Spontanous Breathing  Post-op Assessment: Report given to RN  Post vital signs: Reviewed and stable  Last Vitals:  Vitals Value Taken Time  BP 131/65 09/21/20 2230  Temp    Pulse 70 09/21/20 2231  Resp 17 09/21/20 2231  SpO2 98 % 09/21/20 2231  Vitals shown include unvalidated device data.  Last Pain:  Vitals:   09/21/20 1852  TempSrc:   PainSc: 0-No pain         Complications: No notable events documented.

## 2020-09-22 ENCOUNTER — Encounter (HOSPITAL_COMMUNITY): Payer: Self-pay | Admitting: Obstetrics and Gynecology

## 2020-09-22 LAB — CREATININE, SERUM
Creatinine, Ser: 1.11 mg/dL — ABNORMAL HIGH (ref 0.44–1.00)
GFR, Estimated: >60 mL/min (ref >60)

## 2020-09-22 LAB — CBC
HCT: 31.8 % — ABNORMAL LOW (ref 36.0–46.0)
Hemoglobin: 10.6 g/dL — ABNORMAL LOW (ref 12.0–15.0)
MCH: 29.4 pg (ref 26.0–34.0)
MCHC: 33.3 g/dL (ref 30.0–36.0)
MCV: 88.3 fL (ref 80.0–100.0)
Platelets: 195 10*3/uL (ref 150–400)
RBC: 3.6 MIL/uL — ABNORMAL LOW (ref 3.87–5.11)
RDW: 14 % (ref 11.5–15.5)
WBC: 13.5 10*3/uL — ABNORMAL HIGH (ref 4.0–10.5)
nRBC: 0 % (ref 0.0–0.2)

## 2020-09-22 MED ORDER — IBUPROFEN 600 MG PO TABS
600.0000 mg | ORAL_TABLET | Freq: Four times a day (QID) | ORAL | Status: DC
  Administered 2020-09-22 – 2020-09-23 (×5): 600 mg via ORAL
  Filled 2020-09-22 (×5): qty 1

## 2020-09-22 MED ORDER — FERROUS FUMARATE 324 (106 FE) MG PO TABS
1.0000 | ORAL_TABLET | ORAL | Status: DC
  Filled 2020-09-22: qty 1
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
## 2020-09-22 NOTE — Progress Notes (Signed)
Subjective: Postpartum Day #1: Primary LT Cesarean Delivery for arrest of dilation Patient reports tolerating PO; foley still in place; she hasn't ambulated yet but denies dizziness;  breastfeeding going well; she prefers NFP for contraceptive plan; declines a circ for her son (will do outpatient); she is requesting to be followed postpartum at [facility]  Objective: Vital signs in last 24 hours: Temp:  [97.6 F (36.4 C)-98.8 F (37.1 C)] 97.6 F (36.4 C) (09/06 0645) Pulse Rate:  [55-87] 66 (09/06 0645) Resp:  [9-21] 18 (09/06 0645) BP: (97-159)/(34-110) 117/68 (09/06 0645) SpO2:  [94 %-100 %] 99 % (09/06 0645) Weight:  [161 kg] 161 kg (09/05 1738)  Physical Exam:  General: cooperative, fatigued, and mild distress Lochia: appropriate Uterine Fundus: firm Incision: Prevena in place DVT Evaluation: No evidence of DVT seen on physical exam.  Recent Labs    09/20/20 1251 09/22/20 0001  HGB 12.1 10.6*  HCT 35.5* 31.8*    Assessment/Plan: Status post Cesarean section. Doing well postoperatively.  Continue current care. Her postop Hgb was drawn shortly after her C/S; if she experiences dizziness we can order another one. Fe po ordered qod. Postpartum message sent to [facility] requesting appts.  Stacie Romero CNM 09/22/2020, 8:20 AM

## 2020-09-22 NOTE — Lactation Note (Signed)
This note was copied from a baby's chart. Lactation Consultation Note  Patient Name: Stacie Romero WGNFA'O Date: 09/22/2020 Reason for consult: Follow-up assessment;1st time breastfeeding;Term (LGA term female infant , greater than 9 lbs) Age:35 hours, -1% weight loss. Per mom, with last two feedings infant is no longer gaggy or spitty . LC did not observe recent feeding,  infant breastfeed for 15 minutes at 1648 pm. Per mom, she feels breastfeeding is going well most feedings are 15 minutes in length, she is offering infant both breast during a feeding and breastfeeding infant skin to skin. Per mom, she knows how to hand express and give infant back extra volume with spoon if needed. Mom understands infant may cluster feed at 24 hours and this is normal pattern of infant behavior. Mom will continue to breastfeed infant according to feeding cues, 8 to 12+ or more times within 24 hours, skin to skin. Mom knows to have rest, eat balance meals and drink water to thirst. Mom will call RN/LC if she has any questions, concerns or needs assistance with latching infant at the breast.   Maternal Data Has patient been taught Hand Expression?: Yes  Feeding Mother's Current Feeding Choice: Breast Milk  LATCH Score                    Lactation Tools Discussed/Used    Interventions Interventions: Position options;Skin to skin;Hand express;Education;Expressed milk;Breast compression  Discharge Pump: Personal WIC Program: No  Consult Status Consult Status: Follow-up Date: 09/23/20 Follow-up type: In-patient    Danelle Earthly 09/22/2020, 6:42 PM

## 2020-09-22 NOTE — Lactation Note (Signed)
This note was copied from a baby's chart. Lactation Consultation Note  Patient Name: Stacie Romero TIRWE'R Date: 09/22/2020 Reason for consult: Initial assessment;Primapara;1st time breastfeeding;Term;Infant weight loss;Other (Comment);Maternal endocrine disorder (1 % weightg loss / P1 / baby sound asleep in moms arms STS , gaggy x 1 / mom reacted and burped the baby. LC reviewed the bulb syringe if needed / and need to call on the nurses light. Enc to call with feeding cues) Age:51 hours LC provided the [facility] brochure with resources. / BFSG  Maternal Data Does the patient have breastfeeding experience prior to this delivery?: No  Feeding Mother's Current Feeding Choice: Breast Milk  LATCH Score                    Lactation Tools Discussed/Used    Interventions Interventions: Breast feeding basics reviewed;Education  Discharge    Consult Status Consult Status: Follow-up Date: 09/22/20 Follow-up type: In-patient    Matilde Sprang Hazely Sealey 09/22/2020, 10:23 AM

## 2020-09-22 NOTE — Discharge Summary (Addendum)
Postpartum Discharge Summary    Patient Name: Stacie Romero DOB: [birth year] MRN: [id number]  Date of admission: 09/20/2020 Delivery date:09/21/2020  Delivering provider: Lynnda Shields A  Date of discharge: 09/23/2020  Admitting diagnosis: Normal labor [O80, Z37.9] Intrauterine pregnancy: [redacted]w[redacted]d     Secondary diagnosis:  Principal Problem:   Cesarean delivery delivered Active Problems:   Antepartum multigravida of advanced maternal age   Insulin resistance   Obesity in pregnancy   PCOS (polycystic ovarian syndrome)   Rh negative state in antepartum period  Additional problems: None    Discharge diagnosis: Term Pregnancy Delivered, Gestational Hypertension, and insulin resistance                                               Post partum procedures: None Augmentation: Pitocin Complications: None  Hospital course: Induction of Labor With Cesarean Section   35 y.o. yo [id number] at [redacted]w[redacted]d was admitted to the hospital 09/20/2020 for induction of labor. Patient had a labor course significant for coming in with SROM and latent labor; she had been seen at [facility] for [facility] until 36wks. She had Pitocin started to augment her labor and had an IUPC placed showing adequate ctx for approx 12hrs with cx then beginning to swell at 9cm.The patient went for cesarean section due to Arrest of Dilation. Delivery details are as follows: Membrane Rupture Time/Date:  ,09/19/2020   Delivery Method:C-Section, Low Transverse  Details of operation can be found in separate operative Note.  Patient had an uncomplicated postpartum course. She is ambulating, tolerating a regular diet, passing flatus, and urinating well.  Patient is discharged home in stable condition on 09/23/20.      Newborn Data: Birth date:09/21/2020  Birth time:9:21 PM  Gender:Female  Living status:Living  Apgars:8 ,9  Weight:4610 g (10lb 2.6oz)                               Magnesium Sulfate received: No BMZ received: No Rhophylac: No (mom and baby  both Rh neg) MMR: N/A T-DaP: Offered postpartum Flu: N/A Transfusion: No  Physical exam  Vitals:   09/22/20 2050 09/23/20 0530 09/23/20 1431 09/23/20 1640  BP: 121/86 134/78 (!) 142/72 (!) 142/97  Pulse: 64 64 62 72  Resp: $Remo'18 18 18   'JNunC$ Temp: 97.8 F (36.6 C) 98.5 F (36.9 C)    TempSrc: Oral Oral    SpO2: 100% 99%  100%  Weight:      Height:       General: alert, cooperative, and no distress Lochia: appropriate Uterine Fundus: firm Incision: dressing is clean, dry, and intact, wound vac in place DVT Evaluation: no evidence of DVT seen on physical exam, no calf tenderness to palpation or LE edema   Labs: Lab Results  Component Value Date   WBC 13.5 (H) 09/22/2020   HGB 10.6 (L) 09/22/2020   HCT 31.8 (L) 09/22/2020   MCV 88.3 09/22/2020   PLT 195 09/22/2020   CMP Latest Ref Rng & Units 09/22/2020  Glucose 70 - 99 mg/dL -  BUN 6 - 20 mg/dL -  Creatinine 0.44 - 1.00 mg/dL 1.11(H)  Sodium 135 - 145 mmol/L -  Potassium 3.5 - 5.1 mmol/L -  Chloride 98 - 111 mmol/L -  CO2 22 - 32 mmol/L -  Calcium 8.9 -  10.3 mg/dL -  Total Protein 6.5 - 8.1 g/dL -  Total Bilirubin 0.3 - 1.2 mg/dL -  Alkaline Phos 38 - 126 U/L -  AST 15 - 41 U/L -  ALT 0 - 44 U/L -   Edinburgh Score: Edinburgh Postnatal Depression Scale Screening Tool 09/23/2020  I have been able to laugh and see the funny side of things. 0  I have looked forward with enjoyment to things. 0  I have blamed myself unnecessarily when things went wrong. 2  I have been anxious or worried for no good reason. 2  I have felt scared or panicky for no good reason. 0  Things have been getting on top of me. 0  I have been so unhappy that I have had difficulty sleeping. 1  I have felt sad or miserable. 0  I have been so unhappy that I have been crying. 0  The thought of harming myself has occurred to me. 0  Edinburgh Postnatal Depression Scale Total 5     After visit meds:  Allergies as of 09/23/2020   No Known Allergies       Medication List     TAKE these medications    acetaminophen 325 MG tablet Commonly known as: TYLENOL Take 2 tablets (650 mg total) by mouth every 4 (four) hours as needed for mild pain (temperature > 101.5.).   ibuprofen 200 MG tablet Commonly known as: ADVIL Take 3 tablets (600 mg total) by mouth every 6 (six) hours.   NIFEdipine 30 MG 24 hr tablet Commonly known as: ADALAT CC Take 1 tablet (30 mg total) by mouth daily.         Discharge home in stable condition Infant Feeding: Breast Infant Disposition: home with mother Discharge instruction: per After Visit Summary and Postpartum booklet. Activity: Advance as tolerated. Pelvic rest for 6 weeks.  Diet: carb modified diet Future Appointments: Future Appointments  Date Time Provider [place]  10/05/2020  9:20 AM [facility] NURSE [facility]  10/21/2020  1:15 PM Griffin Basil, MD [facility]   Follow up Visit:  Myrtis Ser, CNM  P [facility] Admin Pool *She can go to our office that is most convenient for her*  Please schedule this patient for Postpartum visit in: 4 weeks with the following provider: Any provider  In-Person  For C/S patients schedule nurse incision check in weeks 2 weeks: yes  High risk pregnancy complicated by: gHTN, insulin resistance, pt of [place] and she is requesting PP f/u with [place]  Delivery mode:  CS  Anticipated Birth Control:  NFP  PP Procedures needed: Incision check and BP check in 1wk; needs 2hr GTT at 4wk visit  Schedule Integrated [place] visit: no   GME ATTESTATION:  I saw and evaluated the patient. I agree with the findings and the plan of care as documented in the resident's note. I have made changes to documentation as necessary.   Procardia 30 mg daily started for elevated BP postpartum. Will have BP check and incision check in 1 week. Otherwise meeting postpartum milestones and stable for discharge home.  Vilma Meckel, MD OB Fellow, [place] for [place] 09/23/2020 8:59 PM

## 2020-09-22 NOTE — Anesthesia Postprocedure Evaluation (Signed)
Anesthesia Post Note  Patient: Stacie Romero  Procedure(s) Performed: CESAREAN SECTION     Patient location during evaluation: Mother Baby Anesthesia Type: Epidural Level of consciousness: oriented and awake and alert Pain management: pain level controlled Vital Signs Assessment: post-procedure vital signs reviewed and stable Respiratory status: spontaneous breathing and respiratory function stable Cardiovascular status: blood pressure returned to baseline and stable Postop Assessment: no headache, no backache, no apparent nausea or vomiting and able to ambulate Anesthetic complications: no   No notable events documented.  Last Vitals:  Vitals:   09/22/20 0145 09/22/20 0245  BP: 140/72 (!) 143/73  Pulse: 60 63  Resp: 20 20  Temp: 37 C 36.9 C  SpO2: 97% 99%    Last Pain:  Vitals:   09/22/20 0245  TempSrc: Oral  PainSc: 0-No pain   Pain Goal:                   Trevor Iha

## 2020-09-23 MED ORDER — IBUPROFEN 200 MG PO TABS: 600.0000 mg | ORAL_TABLET | Freq: Four times a day (QID) | ORAL | Status: DC

## 2020-09-23 MED ORDER — NIFEDIPINE ER OSMOTIC RELEASE 30 MG PO TB24: 30.0000 mg | ORAL_TABLET | Freq: Every day | ORAL | Status: DC

## 2020-09-23 MED ORDER — NIFEDIPINE ER 30 MG PO TB24: 30.0000 mg | ORAL_TABLET | Freq: Every day | ORAL | 0 refills | Status: DC

## 2020-09-23 MED ORDER — NIFEDIPINE ER OSMOTIC RELEASE 30 MG PO TB24
30.0000 mg | ORAL_TABLET | Freq: Every day | ORAL | Status: DC
  Administered 2020-09-23: 30 mg via ORAL
  Filled 2020-09-23: qty 1

## 2020-09-23 MED ORDER — ACETAMINOPHEN 325 MG PO TABS: 650.0000 mg | ORAL_TABLET | ORAL | Status: DC | PRN

## 2020-09-23 NOTE — Progress Notes (Signed)
Pt sleeping when entering room. FOB awake at pt. bedside. I advised FOB I was there to review discharge instructions. FOB did not want to wake pt at this time. I encouraged FOB to call for nurse when pt was awake. Will continue to monitor newborn.

## 2020-09-23 NOTE — Progress Notes (Signed)
Pt and FOB given d/c instructions. Reviewed signs and symptoms of postpartum hypertension and when to notify MD with pt and FOB.  Pt. and FOB given handout for postpartum hypertension. Pt and FOB verbalized understanding.

## 2020-09-23 NOTE — Progress Notes (Addendum)
Post Operative Day 2 Subjective: no complaints, up ad lib, voiding, tolerating PO, and + flatus Would like to stay one more day.   Objective: Blood pressure 134/78, pulse 64, temperature 98.5 F (36.9 C), temperature source Oral, resp. rate 18, height 5\' 9"  (1.753 m), weight (!) 161 kg, last menstrual period 12/17/2019, SpO2 99 %, unknown if currently breastfeeding.  Physical Exam:  General: alert, cooperative, and morbidly obese Lochia: appropriate Uterine Fundus: firm Incision: healing well, no significant drainage, wound vac in place DVT Evaluation: No evidence of DVT seen on physical exam. No LE edema or calf tenderness on exam.   Recent Labs    09/20/20 1251 09/22/20 0001  HGB 12.1 10.6*  HCT 35.5* 31.8*    Assessment/Plan: Plan for discharge tomorrow, Breastfeeding, and Contraception (NFP) #gHTN: Mild range pressures shortly after delivery. Has since had normal BP until this AM with mildly elevated systolic pressure of 134/78. Will continue to monitor closely today and consider starting Procardia 30 mg if BP remains elevated.    LOS: 3 days   11/22/20 09/23/2020, 8:05 AM   GME ATTESTATION:  I saw and evaluated the patient. I agree with the findings and the plan of care as documented in the resident's note. I have made changes to documentation as necessary.   Plan for discharge POD#3. Monitor BP today and consider adding Procardia if BP remains elevated. PO iron for acute blood loss anemia post-CS.   11/23/2020, MD OB Fellow, Faculty [facility], Center for [facility] 09/23/2020 9:01 AM

## 2020-09-23 NOTE — Lactation Note (Signed)
This note was copied from a baby's chart. Lactation Consultation Note  Patient Name: Stacie Romero [id number] Date: 09/23/2020 Reason for consult: Follow-up assessment;Term Age:35 hours  LC in to room for follow up. Parents are expecting discharge. Discussed normal newborn behavior and patterns, clusterfeeding, voids and stools as signs good intake. Talked about milk coming into volume. Infant shows hunger cues, mother latches independently. Reinforced deep, asymmetrical latch and neck/back support. Mother denies pain or discomfort with latch. Infant is still breastfeeding upon leaving room.  Plan: 1-Breastfeeding on demand or 8-12 times in 24h period. 2-Keep infant awake during breastfeeding session: massaging breast, infant's hand/shoulder/feet 3-Encouraged maternal rest, hydration and food intake.   Contact LC as needed for feeds/support/concerns/questions. All questions answered at this time. Reviewed LC brochure and [employer] booklet.     Maternal Data Has patient been taught Hand Expression?: Yes Does the patient have breastfeeding experience prior to this delivery?: No  Feeding Mother's Current Feeding Choice: Breast Milk  LATCH Score Latch: Grasps breast easily, tongue down, lips flanged, rhythmical sucking.  Audible Swallowing: Spontaneous and intermittent  Type of Nipple: Everted at rest and after stimulation  Comfort (Breast/Nipple): Soft / non-tender  Hold (Positioning): Assistance needed to correctly position infant at breast and maintain latch.  LATCH Score: 9   Interventions Interventions: Breast feeding basics reviewed;Assisted with latch;Skin to skin;Breast massage;Hand express;Adjust position;Education;Expressed milk;Position options  Discharge Discharge Education: Engorgement and breast care;Warning signs for feeding baby  Consult Status Consult Status: Complete Date: 09/23/20 Follow-up type: Call as needed    Stacie Romero A Higuera Ancidey 09/23/2020, 1:45  PM

## 2020-10-05 ENCOUNTER — Ambulatory Visit (INDEPENDENT_AMBULATORY_CARE_PROVIDER_SITE_OTHER): Payer: 59

## 2020-10-05 ENCOUNTER — Other Ambulatory Visit: Payer: Self-pay

## 2020-10-05 ENCOUNTER — Telehealth (HOSPITAL_COMMUNITY): Payer: Self-pay | Admitting: *Deleted

## 2020-10-05 DIAGNOSIS — Z5189 Encounter for other specified aftercare: Secondary | ICD-10-CM

## 2020-10-05 MED ORDER — LISINOPRIL 10 MG PO TABS: 10.0000 mg | ORAL_TABLET | Freq: Every day | ORAL | 1 refills | Status: AC

## 2020-10-05 NOTE — Telephone Encounter (Signed)
Mom reports feeling well. Home nurse came this morning. Incision looks good. No concerns about herself. EPDS=3 [facility] score = 5) Mom reports baby is fine. Feeding well, peeing and pooping well. Sleeps in bassinet in mom's room on back. No concerns about baby.  Duffy Rhody, RN 10-05-2020 at 12:12pm

## 2020-10-05 NOTE — Progress Notes (Signed)
Pt here today for wound check. Pt had C-section on 09/21/2020.  Pt denies any drainage, redness or fever to site.   Incision today is clean, dry, intact and well approximated. Pt had Provena Wound vac dressing that were removed today. Pt tolerated well.   Pt advised to keep clean and dry. Pt verbalized understanding.   Pt states has not taken BP med since PP day 1. Pt states felt dizzy, lightheaded and nauseous with vomiting.   BP: 159/96 reviewed with Dr Alysia Penna, pt advised to start Lisinopril 10mg  QD # 30, 1 RF. Pt advised and verbalized understanding. Rx sent to pharmacy on file.   Pt has scheduled PP visit on 10/21/2020.   12/21/2020, RN

## 2020-10-05 NOTE — Progress Notes (Signed)
Agree with A & P. 

## 2020-10-12 ENCOUNTER — Other Ambulatory Visit: Payer: Self-pay

## 2020-10-12 ENCOUNTER — Encounter: Payer: Self-pay | Admitting: General Practice

## 2020-10-12 ENCOUNTER — Ambulatory Visit (INDEPENDENT_AMBULATORY_CARE_PROVIDER_SITE_OTHER): Payer: 59 | Admitting: General Practice

## 2020-10-12 VITALS — BP 161/102 | HR 86 | Ht 69.0 in | Wt 295.0 lb

## 2020-10-12 DIAGNOSIS — T8149XA Infection following a procedure, other surgical site, initial encounter: Secondary | ICD-10-CM

## 2020-10-12 DIAGNOSIS — Z0130 Encounter for examination of blood pressure without abnormal findings: Secondary | ICD-10-CM

## 2020-10-12 MED ORDER — CEPHALEXIN 500 MG PO CAPS: 500.0000 mg | ORAL_CAPSULE | Freq: Three times a day (TID) | ORAL | 0 refills | Status: AC

## 2020-10-12 NOTE — Progress Notes (Signed)
Patient presents to office today for blood pressure check following up from visit last week with elevated reading. Patient states she has not taken the prescribed blood pressure medicine since last visit as she felt her elevated blood pressure was related to stress and lack of sleep that day. Patient states if it was elevated again today, she is agreeable to take the medicine. She denies dizziness, blurry vision, or headaches. + 1 edema noted today. BP 161/102. Discussed with patient concerning nature of elevated BP today and importance of taking lisinopril. Reviewed safety with breastfeeding and provided reassurance. Patient states she will begin taking the medicine today.   Patient reports concern over c-section incision. Patient states it has been draining quite a lot since her last visit and she has also noticed a large bump as well near it that has increased in size.   Incision noted to have erythema primarily on right side with approximately 11cm fluid filled area, non tender and no drainage present. Dr Donavan Foil in to view incision and ordered CT scan of abd/pel and keflex 500mg  TID x 7 days. CT scan scheduled 9/30 @ 430pm. Patient should follow up in 1 week for BP check and incision check. Patient already has appt on 10/5 with Dr 10/30- will follow up then.   Donavan Foil RN BSN 10/12/20

## 2020-10-12 NOTE — Progress Notes (Signed)
Patient was assessed and managed by nursing staff during this encounter. I have reviewed the chart and agree with the documentation and plan. I have also made any necessary editorial changes.   Large soft area on right side of wound.  ? Seroma.  CT scan ordered.  May be amenable to CT guided drainage if superficial.  Continue antibiotics..  Follow up in 1 week.  Warden Fillers, MD 10/12/2020 12:42 PM

## 2020-10-15 ENCOUNTER — Ambulatory Visit (INDEPENDENT_AMBULATORY_CARE_PROVIDER_SITE_OTHER): Payer: 59

## 2020-10-15 ENCOUNTER — Other Ambulatory Visit: Payer: Self-pay

## 2020-10-15 VITALS — BP 148/91 | HR 75 | Wt 292.4 lb

## 2020-10-15 DIAGNOSIS — Z5189 Encounter for other specified aftercare: Secondary | ICD-10-CM

## 2020-10-15 NOTE — Progress Notes (Deleted)
[facility]      Stacie Romero      Stacie Romero

## 2020-10-15 NOTE — Progress Notes (Signed)
Here today for wound check following c-section on 09/21/20. Incision is clean, dry, and intact. Some redness to surrounding area. Large mass still present at incision site as seen at appt on 10/12/20. No increased warmth to the area. Pt reports this is painful to the touch. Continues to have regular bowel movements and no urinary symptoms. Reviewed with Donavan Foil, MD who states he cannot recommend any further intervention without CT imaging. He states this will need intervention, but the CT is necessary to determine if this is OR vs IR. Pt is currently taking prescribed antibiotic in case of any infection present.  BP today is elevated at 148/91. Pt was not taking BP med prior to 10/12/20; BP at that appt was 161/102. Denies any s/s of hypertension today. States she is currently taking lisinopril, discussed importance of continuing this medication. Encouraged pt to continue taking BP med. Will follow up at appt with Donavan Foil, MD next week.   She was initially scheduled for a CT appt on 10/16/20. This was cancelled yesterday due to not having PA. Preservice Department notified Oleh Genin who completes PAs for our office this morning that this needed to be completed. The patient is upset that her CT has been cancelled. The patient is willing to pay out of pocket if this is not approved by her appointment on Monday. Rescheduled appointment for Monday at 5 PM. Oleh Genin started PA process immediately, requests clinical staff call insurance company. [employer] department. Spoke with Aundra Millet who states this PA will require clinical information to prove medical necessity of imaging. Requests information regarding prior diagnostic studies, conservative treatment that has been trialed, documentation of physical exam, list of medications with purpose and duration listed. Requested peer to peer conversation. No MD with [employer] available for conversation; PA approved since unable to provide MD. [id number].  Scheduling department lead (April Pait) and practice director Vonzella Nipple, [place]) notified.   Fleet Contras RN  10/15/20

## 2020-10-16 ENCOUNTER — Ambulatory Visit (HOSPITAL_COMMUNITY): Payer: 59

## 2020-10-17 ENCOUNTER — Encounter: Payer: Self-pay | Admitting: Radiology

## 2020-10-19 ENCOUNTER — Ambulatory Visit (HOSPITAL_COMMUNITY)
Admission: RE | Admit: 2020-10-19 | Discharge: 2020-10-19 | Disposition: A | Discharge status: Home / Self Care | Payer: 59 | Source: Ambulatory Visit | Attending: Obstetrics and Gynecology | Admitting: Obstetrics and Gynecology

## 2020-10-19 ENCOUNTER — Other Ambulatory Visit: Payer: Self-pay

## 2020-10-19 DIAGNOSIS — T8149XA Infection following a procedure, other surgical site, initial encounter: Secondary | ICD-10-CM | POA: Insufficient documentation

## 2020-10-19 LAB — POCT I-STAT CREATININE: Creatinine, Ser: 1 mg/dL (ref 0.44–1.00)

## 2020-10-19 MED ORDER — IOHEXOL 350 MG/ML SOLN
100.0000 mL | Freq: Once | INTRAVENOUS | Status: AC | PRN
  Administered 2020-10-19: 100 mL via INTRAVENOUS

## 2020-10-20 NOTE — Progress Notes (Signed)
Patient was assessed and managed by nursing staff during this encounter. I have reviewed the chart and agree with the documentation and plan. I have also made any necessary editorial changes.  Warden Fillers, MD 10/20/2020 8:13 AM

## 2020-10-21 ENCOUNTER — Other Ambulatory Visit: Payer: Self-pay

## 2020-10-21 ENCOUNTER — Ambulatory Visit (INDEPENDENT_AMBULATORY_CARE_PROVIDER_SITE_OTHER): Payer: 59 | Admitting: Obstetrics and Gynecology

## 2020-10-21 ENCOUNTER — Other Ambulatory Visit (HOSPITAL_COMMUNITY)
Admission: RE | Admit: 2020-10-21 | Discharge: 2020-10-21 | Disposition: A | Discharge status: Home / Self Care | Payer: 59 | Source: Ambulatory Visit | Attending: Obstetrics and Gynecology | Admitting: Obstetrics and Gynecology

## 2020-10-21 DIAGNOSIS — I158 Other secondary hypertension: Secondary | ICD-10-CM | POA: Diagnosis not present

## 2020-10-21 DIAGNOSIS — I10 Essential (primary) hypertension: Secondary | ICD-10-CM | POA: Insufficient documentation

## 2020-10-21 DIAGNOSIS — O9089 Other complications of the puerperium, not elsewhere classified: Secondary | ICD-10-CM | POA: Diagnosis present

## 2020-10-21 NOTE — Progress Notes (Signed)
Post Partum Visit Note  Stacie Romero is a 35 y.o. G10P1041 female who presents for a postpartum visit. She is 4 weeks 2 days postpartum following a primary cesarean section.  I have fully reviewed the prenatal and intrapartum course. The delivery was at 39w 56 gestational weeks.  Anesthesia: epidural. Postpartum course has been complication by large fluid collection at c section scar. Baby is doing well. Baby is feeding by both breast and bottle - Kendamil . Bleeding staining only. Bowel function is abnormal: reports daily BM, but feels she is constipated . Bladder function is normal. Patient is not sexually active. Contraception method is condoms. Postpartum depression screening: negative.   The pregnancy intention screening data noted above was reviewed. Potential methods of contraception were discussed. The patient elected to proceed with natural family planning.   Edinburgh Postnatal Depression Scale - 10/21/20 1332       Edinburgh Postnatal Depression Scale:  In the Past 7 Days   I have been able to laugh and see the funny side of things. 0    I have looked forward with enjoyment to things. 0    I have blamed myself unnecessarily when things went wrong. 2    I have been anxious or worried for no good reason. 0    I have felt scared or panicky for no good reason. 0    Things have been getting on top of me. 0    I have been so unhappy that I have had difficulty sleeping. 0    I have felt sad or miserable. 0    I have been so unhappy that I have been crying. 0    The thought of harming myself has occurred to me. 0    Edinburgh Postnatal Depression Scale Total 2             Health Maintenance Due  Topic Date Due   COVID-19 Vaccine (1) Never done   HIV Screening  Never done   Hepatitis C Screening  Never done   TETANUS/TDAP  Never done   INFLUENZA VACCINE  Never done    The following portions of the patient's history were reviewed and updated as appropriate: allergies,  current medications, past family history, past medical history, past social history, past surgical history, and problem list.  Review of Systems Pertinent items are noted in HPI.  Objective:  BP 136/86   Pulse 70   Wt 294 lb 14.4 oz (133.8 kg)   LMP  (LMP Unknown)   BMI 43.55 kg/m    General:  alert, cooperative, no distress, and morbidly obese   Breasts:  not indicated  Lungs: clear to auscultation bilaterally  Heart:  regular rate and rhythm  Abdomen: Large decrease in size of right sided fluid collection, see note    Wound See note  GU exam:  not indicated      Drainage procedure:  The fluid collection was swabbed with betadine x 2.  Local anesthesia was placed with 10 ml of 1 % lidocaine with epi.  With u/s guidance an 18 gauge needle was placed into the fluid collection and approximately 30 ml of reddish yellow fluid was drawn with resolution of palpable fluid collection.  Pt felt instant improvement in pressure.  The fluid was sent to pathology for further eval.  The operative site was hemostatic.   Assessment:    Normal  postpartum exam.   Plan:   Essential components of care per ACOG recommendations:  1.  Mood and well being: Patient with negative depression screening today. Reviewed local resources for support.  - Patient tobacco use? No.   - hx of drug use? No.    2. Infant care and feeding:  -Patient currently breastmilk feeding? Yes. Reviewed importance of draining breast regularly to support lactation.  -Social determinants of health (SDOH) reviewed in EPIC. No concerns.  3. Sexuality, contraception and birth spacing - Patient does not want a pregnancy in the next year.  Desired family size is 2 children.  - Reviewed forms of contraception in tiered fashion. Patient desired natural family planning (NFP) today.   - Discussed birth spacing of 18 months  4. Sleep and fatigue -Encouraged family/partner/community support of 4 hrs of uninterrupted sleep to help  with mood and fatigue  5. Physical Recovery  - Discussed patients delivery and complications. She describes her labor as mixed. - Patient had a C-section failure to progress. Patient had  no  laceration. Perineal healing reviewed. Patient expressed understanding - Patient has urinary incontinence? No. - Patient is safe to resume physical and sexual activity  6.  Health Maintenance - HM due items addressed Yes - Last pap smear 02/10/20. Pap smear not done at today's visit.  -Breast Cancer screening indicated? No.   7. Chronic Disease/Pregnancy Condition follow up: Hypertension Referral to [facility] medicine for continued well care Pt may need BP med refill before seen by Fam medicine - PCP follow up   F/u in 1 year for annual exam. Warden Fillers, MD Center for [facility], [facility]

## 2020-10-23 LAB — SURGICAL PATHOLOGY

## 2021-03-25 ENCOUNTER — Other Ambulatory Visit: Payer: Self-pay | Admitting: Obstetrics and Gynecology

## 2021-03-25 NOTE — Telephone Encounter (Signed)
Left detailed message on vm informing patient on vm. ?

## 2023-02-12 IMAGING — CT CT ABD-PELV W/ CM
3 of 5 series · 17 of 46 positions shown, 19 images · IV contrast (APPLIED)
Comparison: None.

CLINICAL DATA: Abdominal abscess or infection is noted after
Caesarean section 4 weeks ago.

EXAM:
CT ABDOMEN AND PELVIS WITH CONTRAST
TECHNIQUE: Multidetector CT imaging of the abdomen and pelvis was performed
using the standard protocol following bolus administration of
intravenous contrast.
CONTRAST:  100mL OMNIPAQUE IOHEXOL 350 MG/ML SOLN

[Series 2: axial st · axial · 0.98mm/px · z∈[+358,+794]mm · 12 of 103 slices shown, 14 images]
[im 8/103  soft-tissue]
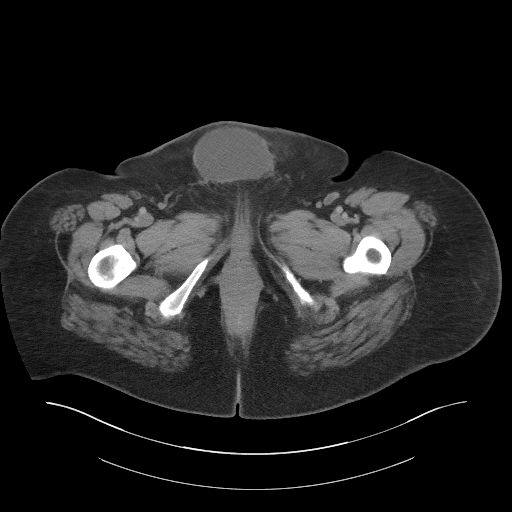
[im 8/103  bone]
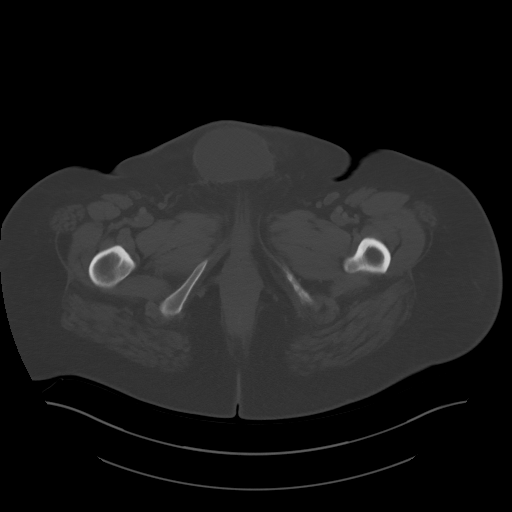
[im 16/103  soft-tissue]
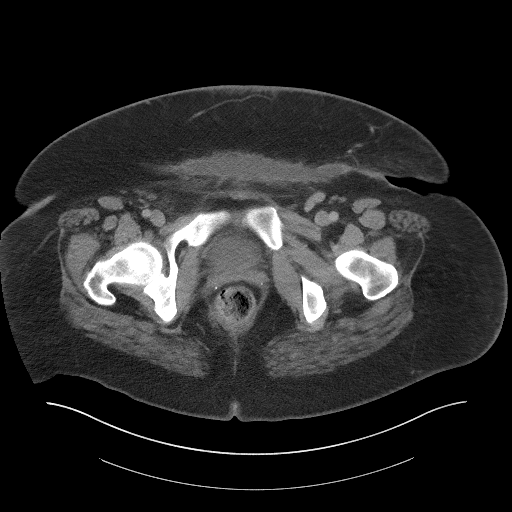
[im 24/103  soft-tissue]
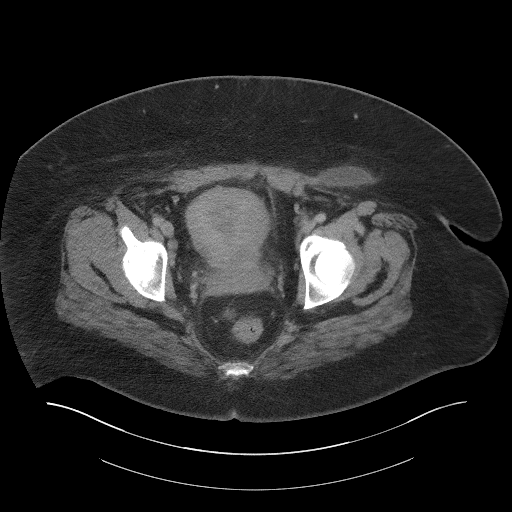
[im 32/103  soft-tissue]
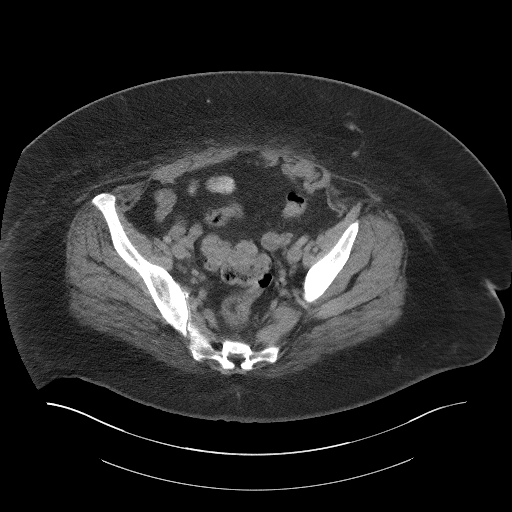
[im 40/103  soft-tissue]
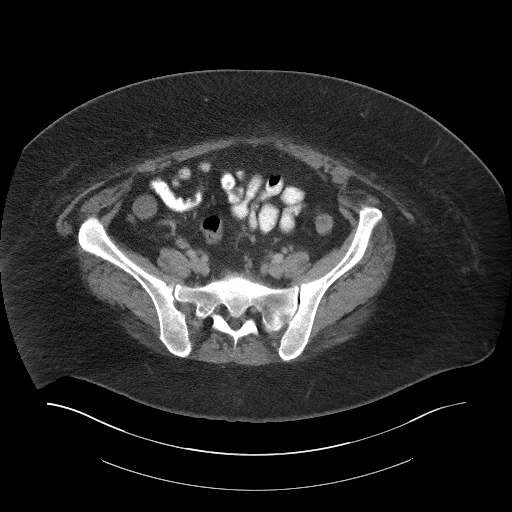
[im 48/103  soft-tissue]
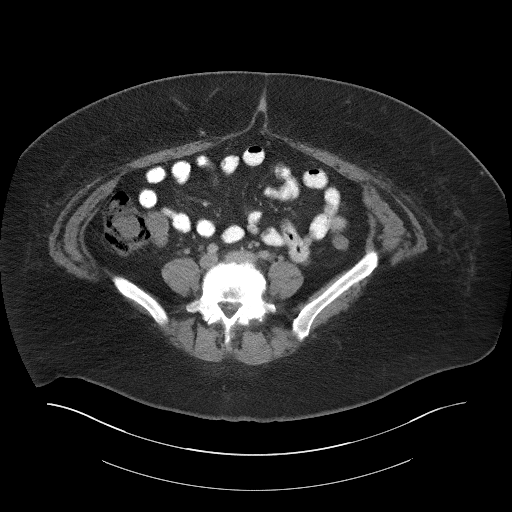
[im 55/103  soft-tissue]
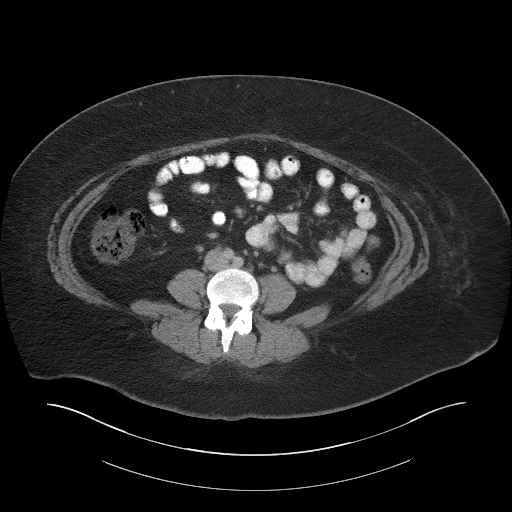
[im 63/103  soft-tissue]
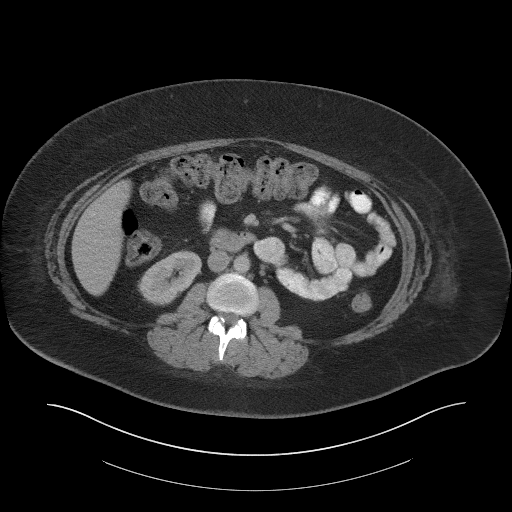
[im 71/103  soft-tissue]
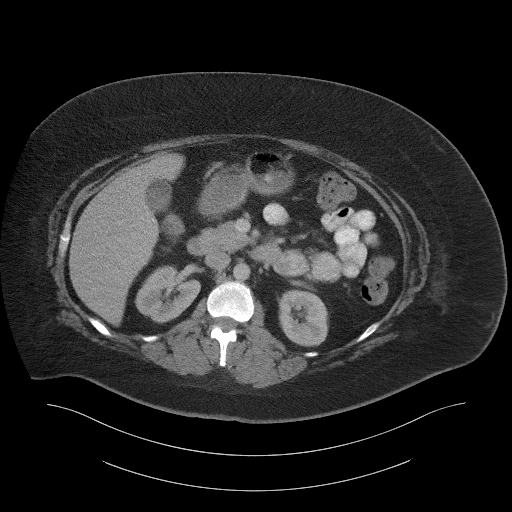
[im 71/103  bone]
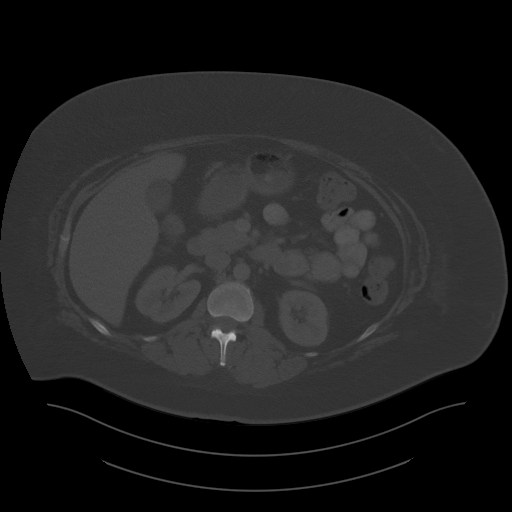
[im 79/103  soft-tissue]
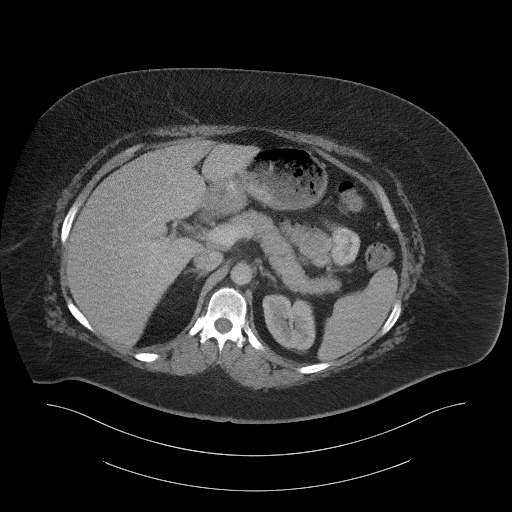
[im 87/103  soft-tissue]
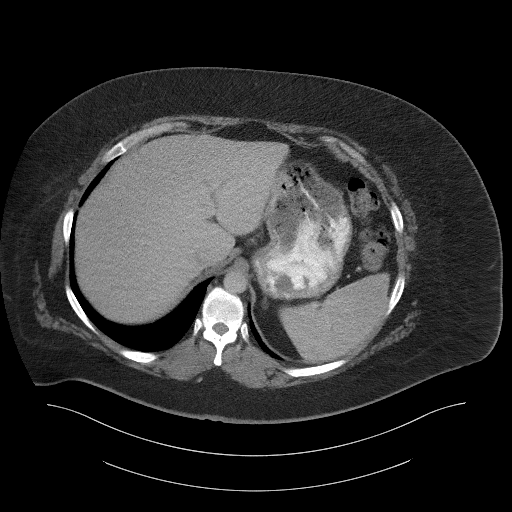
[im 95/103  soft-tissue]
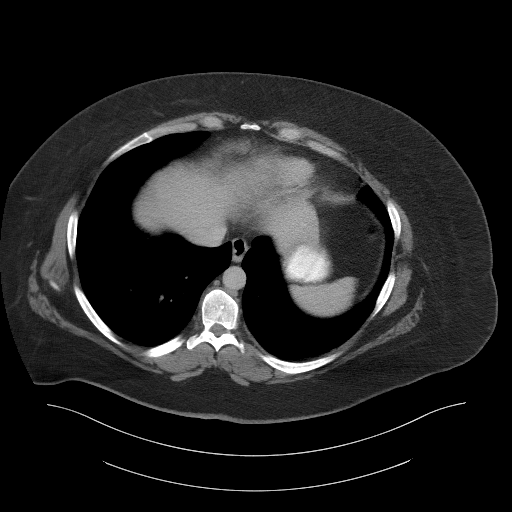

[Series 4: lung bases · axial · 0.79mm/px · z∈[+692,+708]mm · 2 of 79 slices shown]
[im 8/79  bone]
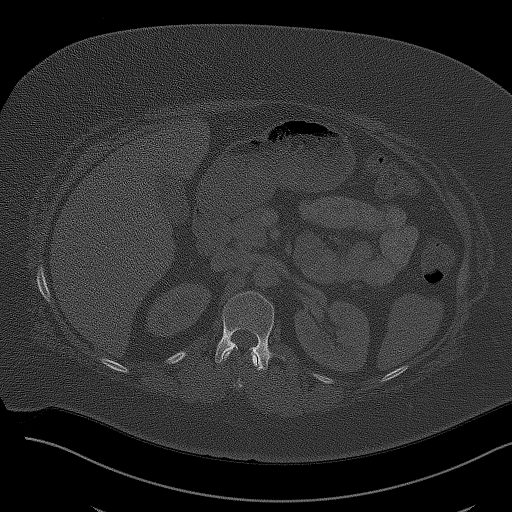
[im 16/79  bone]
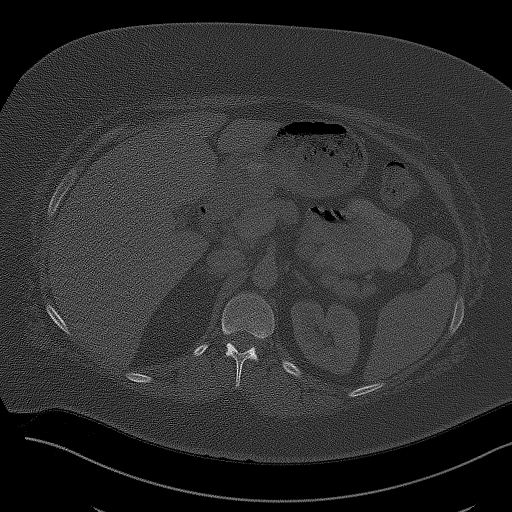

[Series 5: coronal st · coronal · 0.95mm/px · 3 of 113 slices shown]
[im 38/113  soft-tissue]
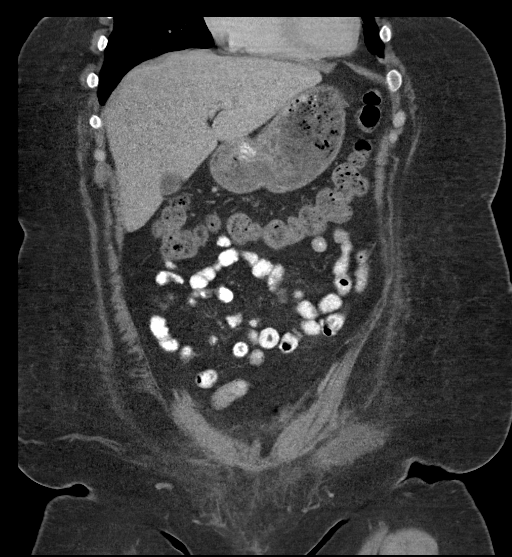
[im 50/113  soft-tissue]
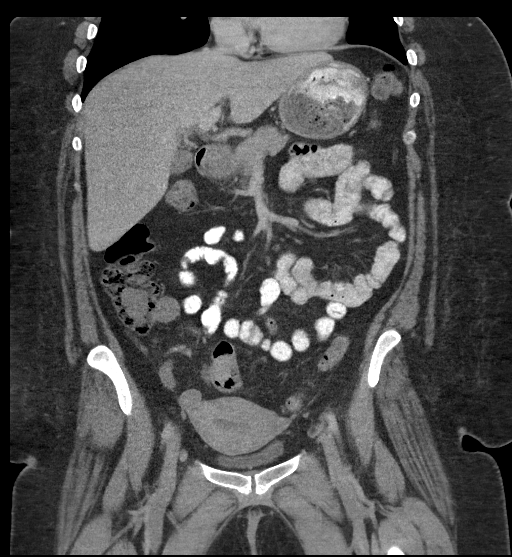
[im 63/113  soft-tissue]
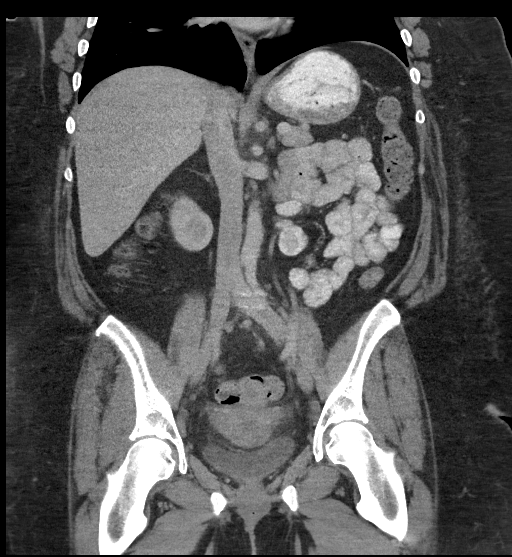

[17 of 46 positions shown; findings below may reference images not displayed]

FINDINGS: Lower chest: No acute abnormality.

Hepatobiliary: No focal liver abnormality is seen. No gallstones,
gallbladder wall thickening, or biliary dilatation.

Pancreas: Unremarkable. No pancreatic ductal dilatation or
surrounding inflammatory changes.

Spleen: Normal in size without focal abnormality.

Adrenals/Urinary Tract: Adrenal glands are unremarkable. Kidneys are
normal, without renal calculi, focal lesion, or hydronephrosis.
Bladder is unremarkable.

Stomach/Bowel: Stomach is within normal limits. Appendix appears
normal. No evidence of bowel wall thickening, distention, or
inflammatory changes.

Vascular/Lymphatic: No significant vascular findings are present. No
enlarged abdominal or pelvic lymph nodes.

Reproductive: Uterus and bilateral adnexa are unremarkable.

Other: 17.8 x 9.6 cm fluid collection is noted in the infraumbilical
region of the anterior pelvic wall concerning for seroma or possibly
abscess.

Musculoskeletal: No acute or significant osseous findings.
IMPRESSION: 17.8 x 9.6 cm fluid collection is noted in the infraumbilical region
of the anterior pelvic wall concerning for postoperative seroma or
possibly abscess. These results will be called to the ordering
clinician or representative by the Radiologist Assistant, and
communication documented in the PACS or zVision Dashboard.

## 2023-08-23 ENCOUNTER — Other Ambulatory Visit: Payer: Self-pay | Admitting: Medical Genetics

## 2023-11-03 ENCOUNTER — Other Ambulatory Visit: Payer: Self-pay | Admitting: Medical Genetics

## 2023-11-03 DIAGNOSIS — Z006 Encounter for examination for normal comparison and control in clinical research program: Secondary | ICD-10-CM

## 2023-12-29 LAB — GENECONNECT MOLECULAR SCREEN: Genetic Analysis Overall Interpretation: NEGATIVE
